# Patient Record
Sex: Female | Born: 1935 | Race: White | Hispanic: No | Smoking: Never smoker
Health system: Southern US, Community
[De-identification: ages and names within clinical notes are randomized; demographics above are authoritative.]

## PROBLEM LIST (undated history)

## (undated) DIAGNOSIS — I10 Essential (primary) hypertension: Secondary | ICD-10-CM

## (undated) DIAGNOSIS — F0390 Unspecified dementia without behavioral disturbance: Secondary | ICD-10-CM

## (undated) DIAGNOSIS — E079 Disorder of thyroid, unspecified: Secondary | ICD-10-CM

## (undated) DIAGNOSIS — R609 Edema, unspecified: Secondary | ICD-10-CM

## (undated) HISTORY — DX: Essential (primary) hypertension: I10

## (undated) HISTORY — DX: Unspecified dementia, unspecified severity, without behavioral disturbance, psychotic disturbance, mood disturbance, and anxiety: F03.90

## (undated) HISTORY — DX: Edema, unspecified: R60.9

## (undated) HISTORY — DX: Disorder of thyroid, unspecified: E07.9

---

## 2004-06-12 ENCOUNTER — Ambulatory Visit (HOSPITAL_COMMUNITY): Admission: RE | Admit: 2004-06-12 | Discharge: 2004-06-12 | Payer: Self-pay | Admitting: Family Medicine

## 2006-02-23 ENCOUNTER — Ambulatory Visit (HOSPITAL_COMMUNITY): Admission: RE | Admit: 2006-02-23 | Discharge: 2006-02-23 | Payer: Self-pay | Admitting: Family Medicine

## 2006-04-29 ENCOUNTER — Ambulatory Visit (HOSPITAL_COMMUNITY): Admission: RE | Admit: 2006-04-29 | Discharge: 2006-04-29 | Payer: Self-pay | Admitting: Family Medicine

## 2006-06-03 ENCOUNTER — Ambulatory Visit (HOSPITAL_COMMUNITY): Admission: RE | Admit: 2006-06-03 | Discharge: 2006-06-03 | Payer: Self-pay | Admitting: Cardiovascular Disease

## 2006-06-08 ENCOUNTER — Ambulatory Visit (HOSPITAL_BASED_OUTPATIENT_CLINIC_OR_DEPARTMENT_OTHER): Admission: RE | Admit: 2006-06-08 | Discharge: 2006-06-08 | Payer: Self-pay | Admitting: Orthopedic Surgery

## 2007-08-03 ENCOUNTER — Ambulatory Visit (HOSPITAL_COMMUNITY): Admission: RE | Admit: 2007-08-03 | Discharge: 2007-08-03 | Payer: Self-pay | Admitting: Family Medicine

## 2008-08-11 ENCOUNTER — Ambulatory Visit (HOSPITAL_COMMUNITY): Admission: RE | Admit: 2008-08-11 | Discharge: 2008-08-11 | Payer: Self-pay | Admitting: Family Medicine

## 2009-01-24 ENCOUNTER — Ambulatory Visit (HOSPITAL_COMMUNITY): Admission: RE | Admit: 2009-01-24 | Discharge: 2009-01-24 | Payer: Self-pay | Admitting: Neurology

## 2011-02-28 NOTE — Op Note (Signed)
[name], Teresa Douglas                ACCOUNT NO.:  [id number]   MEDICAL RECORD NO.:  [id number]          PATIENT TYPE:  AMB   LOCATION:  DSC                          FACILITY:  [facility]   PHYSICIAN:  Robert A. Thurston Hole, M.D. DATE OF BIRTH:  May 04, 1926   DATE OF PROCEDURE:  06/08/2006  DATE OF DISCHARGE:                                 OPERATIVE REPORT   PREOPERATIVE DIAGNOSIS:  Left knee medial and lateral meniscal tears with  chondromalacia, degenerative joint disease and synovitis.   POSTOPERATIVE DIAGNOSIS:  Left knee medial and lateral meniscal tears with  chondromalacia, degenerative joint disease and synovitis.   PROCEDURE:  1. Left knee EUA followed by arthroscopic partial medial lateral      meniscectomies.  2. Left knee chondroplasty with partial synovectomy.   SURGEON:  Elana Alm. Thurston Hole, M.D.   ASSISTANT:  Julien Girt, P.A.   ANESTHESIA:  Local with MAC.   OPERATIVE TIME:  30 minutes.   COMPLICATIONS:  None.   INDICATIONS FOR PROCEDURE:  [name] is a 79-year woman who has had 6 months  of increasing left knee pain with exam and MRI and x-rays documenting  chondromalacia, meniscal tearing and DJD and synovitis.  She has failed  conservative care is now to undergo arthroscopy.   DESCRIPTION:  [name] is brought to operating room on 06/08/2006 after knee  block was placed in holding room by anesthesia.  She was placed on operative  table supine position.  Her left knee was examined under anesthesia.  Range  of motion 0 to 125 degrees, 2 to 3+ crepitation, knee stable to ligamentous  exam with normal patellar tracking.  Left leg was prepped using sterile  DuraPrep and draped using sterile technique.  Originally through an  anterolateral portal the arthroscope with a pump attached was placed and  through an anteromedial portal an arthroscopic probe was placed.  On initial  inspection of the medial compartment.  She was found to have 75% grade 3 and  20% grade 4  DJD and chondromalacia.  This was thoroughly debrided.  A  chondroplasty was performed.  She had complex tearing of the posterior  medial and anterior horn of the medial meniscus of which 75 to 80% was  resected back to stable rim.  Intercondylar notch inspected.  Anterior  posterior cruciate ligaments were normal.  Lateral compartment inspected.  She had 50% grade 3 chondromalacia which was debrided.  Lateral meniscus  tear 25% posterolateral corner was resected back to stable rim.  Patellofemoral joint showed 80% grade 3 and 20% grade 4 DJD and  chondromalacia.  This was thoroughly debrided.  The patella tracked  normally.  Significant synovitis in the medial lateral gutters were  debrided.  Otherwise this was free of pathology.  After this was done, it  was felt that all pathology been satisfactorily addressed.  The instruments  removed.  Portals closed with 3-0 nylon suture and injected with 0.25%  Marcaine with epinephrine and 4 mg morphine.  Sterile dressings applied and  the patient awakened and taken to recovery in stable condition.   FOLLOW-UP  CARE:  [name] will be followed as outpatient on Darvocet for  pain and ibuprofen.  See her back in the office in a week for sutures out  follow-up.      Robert A. Thurston Hole, M.D.  Electronically Signed     RAW/MEDQ  D:  06/08/2006  T:  06/09/2006  Job:  [id number]

## 2011-10-30 DIAGNOSIS — E039 Hypothyroidism, unspecified: Secondary | ICD-10-CM | POA: Diagnosis not present

## 2011-10-30 DIAGNOSIS — I10 Essential (primary) hypertension: Secondary | ICD-10-CM | POA: Diagnosis not present

## 2011-10-30 DIAGNOSIS — R7309 Other abnormal glucose: Secondary | ICD-10-CM | POA: Diagnosis not present

## 2011-10-30 DIAGNOSIS — E559 Vitamin D deficiency, unspecified: Secondary | ICD-10-CM | POA: Diagnosis not present

## 2011-11-06 DIAGNOSIS — I10 Essential (primary) hypertension: Secondary | ICD-10-CM | POA: Diagnosis not present

## 2011-11-06 DIAGNOSIS — R7309 Other abnormal glucose: Secondary | ICD-10-CM | POA: Diagnosis not present

## 2011-11-06 DIAGNOSIS — E039 Hypothyroidism, unspecified: Secondary | ICD-10-CM | POA: Diagnosis not present

## 2011-11-06 DIAGNOSIS — E559 Vitamin D deficiency, unspecified: Secondary | ICD-10-CM | POA: Diagnosis not present

## 2011-11-06 DIAGNOSIS — R634 Abnormal weight loss: Secondary | ICD-10-CM | POA: Diagnosis not present

## 2011-11-18 DIAGNOSIS — R0989 Other specified symptoms and signs involving the circulatory and respiratory systems: Secondary | ICD-10-CM | POA: Diagnosis not present

## 2011-11-18 DIAGNOSIS — I10 Essential (primary) hypertension: Secondary | ICD-10-CM | POA: Diagnosis not present

## 2011-12-25 DIAGNOSIS — R634 Abnormal weight loss: Secondary | ICD-10-CM | POA: Diagnosis not present

## 2011-12-25 DIAGNOSIS — I10 Essential (primary) hypertension: Secondary | ICD-10-CM | POA: Diagnosis not present

## 2012-01-15 DIAGNOSIS — R634 Abnormal weight loss: Secondary | ICD-10-CM | POA: Diagnosis not present

## 2012-01-15 DIAGNOSIS — R32 Unspecified urinary incontinence: Secondary | ICD-10-CM | POA: Diagnosis not present

## 2012-01-15 DIAGNOSIS — I10 Essential (primary) hypertension: Secondary | ICD-10-CM | POA: Diagnosis not present

## 2012-01-20 DIAGNOSIS — E559 Vitamin D deficiency, unspecified: Secondary | ICD-10-CM | POA: Diagnosis not present

## 2012-01-20 DIAGNOSIS — R634 Abnormal weight loss: Secondary | ICD-10-CM | POA: Diagnosis not present

## 2012-01-27 DIAGNOSIS — N390 Urinary tract infection, site not specified: Secondary | ICD-10-CM | POA: Diagnosis not present

## 2012-02-03 DIAGNOSIS — R05 Cough: Secondary | ICD-10-CM | POA: Diagnosis not present

## 2012-02-04 DIAGNOSIS — R918 Other nonspecific abnormal finding of lung field: Secondary | ICD-10-CM | POA: Diagnosis not present

## 2012-02-04 DIAGNOSIS — R509 Fever, unspecified: Secondary | ICD-10-CM | POA: Diagnosis not present

## 2012-02-04 DIAGNOSIS — J189 Pneumonia, unspecified organism: Secondary | ICD-10-CM | POA: Diagnosis not present

## 2012-02-04 DIAGNOSIS — J8410 Pulmonary fibrosis, unspecified: Secondary | ICD-10-CM | POA: Diagnosis not present

## 2012-02-04 DIAGNOSIS — J989 Respiratory disorder, unspecified: Secondary | ICD-10-CM | POA: Diagnosis not present

## 2012-02-05 DIAGNOSIS — R509 Fever, unspecified: Secondary | ICD-10-CM | POA: Diagnosis not present

## 2012-02-05 DIAGNOSIS — R0902 Hypoxemia: Secondary | ICD-10-CM | POA: Diagnosis not present

## 2012-02-05 DIAGNOSIS — R918 Other nonspecific abnormal finding of lung field: Secondary | ICD-10-CM | POA: Diagnosis not present

## 2012-02-05 DIAGNOSIS — Z79899 Other long term (current) drug therapy: Secondary | ICD-10-CM | POA: Diagnosis not present

## 2012-02-05 DIAGNOSIS — J449 Chronic obstructive pulmonary disease, unspecified: Secondary | ICD-10-CM | POA: Diagnosis not present

## 2012-02-05 DIAGNOSIS — E876 Hypokalemia: Secondary | ICD-10-CM | POA: Diagnosis not present

## 2012-02-05 DIAGNOSIS — Z882 Allergy status to sulfonamides status: Secondary | ICD-10-CM | POA: Diagnosis not present

## 2012-02-05 DIAGNOSIS — E039 Hypothyroidism, unspecified: Secondary | ICD-10-CM | POA: Diagnosis present

## 2012-02-05 DIAGNOSIS — J159 Unspecified bacterial pneumonia: Secondary | ICD-10-CM | POA: Diagnosis present

## 2012-02-05 DIAGNOSIS — J189 Pneumonia, unspecified organism: Secondary | ICD-10-CM | POA: Diagnosis not present

## 2012-02-05 DIAGNOSIS — Z880 Allergy status to penicillin: Secondary | ICD-10-CM | POA: Diagnosis not present

## 2012-02-05 DIAGNOSIS — Z886 Allergy status to analgesic agent status: Secondary | ICD-10-CM | POA: Diagnosis not present

## 2012-02-05 DIAGNOSIS — I10 Essential (primary) hypertension: Secondary | ICD-10-CM | POA: Diagnosis not present

## 2012-02-05 DIAGNOSIS — J8410 Pulmonary fibrosis, unspecified: Secondary | ICD-10-CM | POA: Diagnosis not present

## 2012-02-05 DIAGNOSIS — F0280 Dementia in other diseases classified elsewhere without behavioral disturbance: Secondary | ICD-10-CM | POA: Diagnosis present

## 2012-02-05 DIAGNOSIS — R3989 Other symptoms and signs involving the genitourinary system: Secondary | ICD-10-CM | POA: Diagnosis not present

## 2012-02-05 DIAGNOSIS — F411 Generalized anxiety disorder: Secondary | ICD-10-CM | POA: Diagnosis present

## 2012-02-12 DIAGNOSIS — J449 Chronic obstructive pulmonary disease, unspecified: Secondary | ICD-10-CM | POA: Diagnosis not present

## 2012-02-12 DIAGNOSIS — E876 Hypokalemia: Secondary | ICD-10-CM | POA: Diagnosis not present

## 2012-02-12 DIAGNOSIS — J189 Pneumonia, unspecified organism: Secondary | ICD-10-CM | POA: Diagnosis not present

## 2012-02-12 DIAGNOSIS — I10 Essential (primary) hypertension: Secondary | ICD-10-CM | POA: Diagnosis not present

## 2012-06-17 DIAGNOSIS — F0390 Unspecified dementia without behavioral disturbance: Secondary | ICD-10-CM | POA: Diagnosis not present

## 2012-06-17 DIAGNOSIS — I10 Essential (primary) hypertension: Secondary | ICD-10-CM | POA: Diagnosis not present

## 2012-06-17 DIAGNOSIS — J449 Chronic obstructive pulmonary disease, unspecified: Secondary | ICD-10-CM | POA: Diagnosis not present

## 2012-06-17 DIAGNOSIS — R634 Abnormal weight loss: Secondary | ICD-10-CM | POA: Diagnosis not present

## 2012-09-03 DIAGNOSIS — F0390 Unspecified dementia without behavioral disturbance: Secondary | ICD-10-CM | POA: Diagnosis not present

## 2012-09-03 DIAGNOSIS — I10 Essential (primary) hypertension: Secondary | ICD-10-CM | POA: Diagnosis not present

## 2012-09-03 DIAGNOSIS — E039 Hypothyroidism, unspecified: Secondary | ICD-10-CM | POA: Diagnosis not present

## 2012-10-28 DIAGNOSIS — E039 Hypothyroidism, unspecified: Secondary | ICD-10-CM | POA: Diagnosis not present

## 2012-10-28 DIAGNOSIS — R634 Abnormal weight loss: Secondary | ICD-10-CM | POA: Diagnosis not present

## 2012-10-28 DIAGNOSIS — I10 Essential (primary) hypertension: Secondary | ICD-10-CM | POA: Diagnosis not present

## 2012-10-28 DIAGNOSIS — J209 Acute bronchitis, unspecified: Secondary | ICD-10-CM | POA: Diagnosis not present

## 2012-11-05 DIAGNOSIS — J209 Acute bronchitis, unspecified: Secondary | ICD-10-CM | POA: Diagnosis not present

## 2012-11-05 DIAGNOSIS — E039 Hypothyroidism, unspecified: Secondary | ICD-10-CM | POA: Diagnosis not present

## 2012-11-05 DIAGNOSIS — I10 Essential (primary) hypertension: Secondary | ICD-10-CM | POA: Diagnosis not present

## 2012-11-05 DIAGNOSIS — R05 Cough: Secondary | ICD-10-CM | POA: Diagnosis not present

## 2013-01-20 DIAGNOSIS — F0390 Unspecified dementia without behavioral disturbance: Secondary | ICD-10-CM | POA: Diagnosis not present

## 2013-01-20 DIAGNOSIS — I10 Essential (primary) hypertension: Secondary | ICD-10-CM | POA: Diagnosis not present

## 2013-01-20 DIAGNOSIS — J309 Allergic rhinitis, unspecified: Secondary | ICD-10-CM | POA: Diagnosis not present

## 2013-01-20 DIAGNOSIS — J069 Acute upper respiratory infection, unspecified: Secondary | ICD-10-CM | POA: Diagnosis not present

## 2013-01-27 DIAGNOSIS — R262 Difficulty in walking, not elsewhere classified: Secondary | ICD-10-CM | POA: Diagnosis not present

## 2013-01-27 DIAGNOSIS — B351 Tinea unguium: Secondary | ICD-10-CM | POA: Diagnosis not present

## 2013-01-27 DIAGNOSIS — M79609 Pain in unspecified limb: Secondary | ICD-10-CM | POA: Diagnosis not present

## 2013-03-15 DIAGNOSIS — J811 Chronic pulmonary edema: Secondary | ICD-10-CM | POA: Diagnosis not present

## 2013-03-17 DIAGNOSIS — I10 Essential (primary) hypertension: Secondary | ICD-10-CM | POA: Diagnosis not present

## 2013-03-17 DIAGNOSIS — F0390 Unspecified dementia without behavioral disturbance: Secondary | ICD-10-CM | POA: Diagnosis not present

## 2013-03-17 DIAGNOSIS — J209 Acute bronchitis, unspecified: Secondary | ICD-10-CM | POA: Diagnosis not present

## 2013-03-17 DIAGNOSIS — J309 Allergic rhinitis, unspecified: Secondary | ICD-10-CM | POA: Diagnosis not present

## 2013-04-19 DIAGNOSIS — B351 Tinea unguium: Secondary | ICD-10-CM | POA: Diagnosis not present

## 2013-05-11 DIAGNOSIS — M25539 Pain in unspecified wrist: Secondary | ICD-10-CM | POA: Diagnosis not present

## 2013-06-16 DIAGNOSIS — I10 Essential (primary) hypertension: Secondary | ICD-10-CM | POA: Diagnosis not present

## 2013-06-16 DIAGNOSIS — J309 Allergic rhinitis, unspecified: Secondary | ICD-10-CM | POA: Diagnosis not present

## 2013-06-16 DIAGNOSIS — F0390 Unspecified dementia without behavioral disturbance: Secondary | ICD-10-CM | POA: Diagnosis not present

## 2013-06-28 DIAGNOSIS — I10 Essential (primary) hypertension: Secondary | ICD-10-CM | POA: Diagnosis not present

## 2013-06-28 DIAGNOSIS — I739 Peripheral vascular disease, unspecified: Secondary | ICD-10-CM | POA: Diagnosis not present

## 2013-06-28 DIAGNOSIS — R220 Localized swelling, mass and lump, head: Secondary | ICD-10-CM | POA: Diagnosis not present

## 2013-06-28 DIAGNOSIS — R0989 Other specified symptoms and signs involving the circulatory and respiratory systems: Secondary | ICD-10-CM | POA: Diagnosis not present

## 2013-10-03 DIAGNOSIS — I70209 Unspecified atherosclerosis of native arteries of extremities, unspecified extremity: Secondary | ICD-10-CM | POA: Diagnosis not present

## 2013-10-03 DIAGNOSIS — B351 Tinea unguium: Secondary | ICD-10-CM | POA: Diagnosis not present

## 2013-10-13 DIAGNOSIS — Z23 Encounter for immunization: Secondary | ICD-10-CM | POA: Diagnosis not present

## 2013-11-03 DIAGNOSIS — F0390 Unspecified dementia without behavioral disturbance: Secondary | ICD-10-CM | POA: Diagnosis not present

## 2013-11-03 DIAGNOSIS — J309 Allergic rhinitis, unspecified: Secondary | ICD-10-CM | POA: Diagnosis not present

## 2013-11-03 DIAGNOSIS — I10 Essential (primary) hypertension: Secondary | ICD-10-CM | POA: Diagnosis not present

## 2013-11-03 DIAGNOSIS — E039 Hypothyroidism, unspecified: Secondary | ICD-10-CM | POA: Diagnosis not present

## 2013-11-15 DIAGNOSIS — E039 Hypothyroidism, unspecified: Secondary | ICD-10-CM | POA: Diagnosis not present

## 2013-11-15 DIAGNOSIS — E559 Vitamin D deficiency, unspecified: Secondary | ICD-10-CM | POA: Diagnosis not present

## 2013-11-15 DIAGNOSIS — I10 Essential (primary) hypertension: Secondary | ICD-10-CM | POA: Diagnosis not present

## 2013-12-02 DIAGNOSIS — F0390 Unspecified dementia without behavioral disturbance: Secondary | ICD-10-CM | POA: Diagnosis not present

## 2013-12-02 DIAGNOSIS — I10 Essential (primary) hypertension: Secondary | ICD-10-CM | POA: Diagnosis not present

## 2013-12-02 DIAGNOSIS — R059 Cough, unspecified: Secondary | ICD-10-CM | POA: Diagnosis not present

## 2013-12-02 DIAGNOSIS — J209 Acute bronchitis, unspecified: Secondary | ICD-10-CM | POA: Diagnosis not present

## 2013-12-03 DIAGNOSIS — R05 Cough: Secondary | ICD-10-CM | POA: Diagnosis not present

## 2013-12-03 DIAGNOSIS — R059 Cough, unspecified: Secondary | ICD-10-CM | POA: Diagnosis not present

## 2013-12-26 DIAGNOSIS — M7989 Other specified soft tissue disorders: Secondary | ICD-10-CM | POA: Diagnosis not present

## 2013-12-26 DIAGNOSIS — M79609 Pain in unspecified limb: Secondary | ICD-10-CM | POA: Diagnosis not present

## 2013-12-26 DIAGNOSIS — L539 Erythematous condition, unspecified: Secondary | ICD-10-CM | POA: Diagnosis not present

## 2013-12-30 ENCOUNTER — Telehealth: Payer: Self-pay | Admitting: General Practice

## 2013-12-30 DIAGNOSIS — L0390 Cellulitis, unspecified: Secondary | ICD-10-CM | POA: Diagnosis not present

## 2013-12-30 DIAGNOSIS — R269 Unspecified abnormalities of gait and mobility: Secondary | ICD-10-CM | POA: Diagnosis not present

## 2013-12-30 DIAGNOSIS — J209 Acute bronchitis, unspecified: Secondary | ICD-10-CM | POA: Diagnosis not present

## 2013-12-30 DIAGNOSIS — R609 Edema, unspecified: Secondary | ICD-10-CM | POA: Diagnosis not present

## 2013-12-30 NOTE — Telephone Encounter (Signed)
PT AWARE AND APPT MADE

## 2013-12-31 DIAGNOSIS — M25559 Pain in unspecified hip: Secondary | ICD-10-CM | POA: Diagnosis not present

## 2014-01-02 ENCOUNTER — Ambulatory Visit: Payer: Self-pay | Admitting: Family Medicine

## 2014-01-03 DIAGNOSIS — B351 Tinea unguium: Secondary | ICD-10-CM | POA: Diagnosis not present

## 2014-01-03 DIAGNOSIS — I70209 Unspecified atherosclerosis of native arteries of extremities, unspecified extremity: Secondary | ICD-10-CM | POA: Diagnosis not present

## 2014-01-03 DIAGNOSIS — M79609 Pain in unspecified limb: Secondary | ICD-10-CM | POA: Diagnosis not present

## 2014-01-09 ENCOUNTER — Encounter (INDEPENDENT_AMBULATORY_CARE_PROVIDER_SITE_OTHER): Payer: Self-pay

## 2014-01-09 ENCOUNTER — Ambulatory Visit (INDEPENDENT_AMBULATORY_CARE_PROVIDER_SITE_OTHER): Payer: Medicare Other | Admitting: Family Medicine

## 2014-01-09 VITALS — BP 139/73 | HR 82 | Temp 98.4°F | Wt 187.0 lb

## 2014-01-09 DIAGNOSIS — E559 Vitamin D deficiency, unspecified: Secondary | ICD-10-CM | POA: Diagnosis not present

## 2014-01-09 DIAGNOSIS — R269 Unspecified abnormalities of gait and mobility: Secondary | ICD-10-CM | POA: Diagnosis not present

## 2014-01-09 DIAGNOSIS — R609 Edema, unspecified: Secondary | ICD-10-CM

## 2014-01-09 DIAGNOSIS — F0390 Unspecified dementia without behavioral disturbance: Secondary | ICD-10-CM | POA: Diagnosis not present

## 2014-01-09 NOTE — Addendum Note (Signed)
Addended by: Lysbeth Penner on: 01/09/2014 06:26 PM   Modules accepted: Orders

## 2014-01-09 NOTE — Addendum Note (Signed)
Addended by: Wyline Mood on: 01/09/2014 06:34 PM   Modules accepted: Orders

## 2014-01-09 NOTE — Progress Notes (Signed)
   Subjective:    Patient ID: Teresa Douglas, female    DOB: Mar 08, 1926, 80 y.o.   MRN: [id number]  HPI This 80 y.o. female presents for evaluation of edema.  She has hx of dementia. She is confused and combative.  She has been residing at [place] unit And she is brought here today by the staff for evaluation.  Her niece Teresa Douglas Is called and she is concerned about her aunt (patient) edema and her recent decreased Ability to ambulate.  She states the patient has not been getting around very well with her walker. She is concerned she may have right heart failure and wants this checked.   Review of Systems C/o edema and decreased mobility No chest pain, SOB, HA, dizziness, vision change, N/V, diarrhea, constipation, dysuria, urinary urgency or frequency, myalgias, arthralgias or rash.     Objective:   Physical Exam Vital signs noted  Elderly confused female in NAD.  HEENT - Head atraumatic Normocephalic                Eyes - PERRLA, Conjuctiva - clear Sclera- Clear EOMI                Ears - EAC's Wnl TM's Wnl Gross Hearing WNL                Throat - oropharanx wnl Respiratory - Lungs CTA bilateral Cardiac - RRR S1 and S2 without murmur GI - Abdomen soft Nontender and bowel sounds active x 4 Extremities - mild 1 plus pedal and pre-tibial edema. Neuro - Confused        Assessment & Plan:  Dementia - Plan: Ambulatory referral to Neurology  Edema - Plan: Echocardiogram Dobutamine stress test, POCT CBC, CMP14+EGFR, TSH, Vit D  25 hydroxy (rtn osteoporosis monitoring)  Abnormality of gait - Plan: Ambulatory referral to Neurology  Vitamin D deficiency - Check vitamin D level.  Lysbeth Penner FNP

## 2014-01-10 LAB — CBC WITH DIFFERENTIAL
Basophils Absolute: 0.1 10*3/uL (ref 0.0–0.2)
Basos: 1 %
Eos: 2 %
Eosinophils Absolute: 0.2 10*3/uL (ref 0.0–0.4)
HCT: 41.7 % (ref 34.0–46.6)
Hemoglobin: 13.4 g/dL (ref 11.1–15.9)
Immature Grans (Abs): 0 10*3/uL (ref 0.0–0.1)
Immature Granulocytes: 0 %
Lymphocytes Absolute: 2.3 10*3/uL (ref 0.7–3.1)
Lymphs: 28 %
MCH: 31.9 pg (ref 26.6–33.0)
MCHC: 32.1 g/dL (ref 31.5–35.7)
MCV: 99 fL — ABNORMAL HIGH (ref 79–97)
Monocytes Absolute: 0.7 10*3/uL (ref 0.1–0.9)
Monocytes: 8 %
Neutrophils Absolute: 5.2 10*3/uL (ref 1.4–7.0)
Neutrophils Relative %: 61 %
Platelets: 169 10*3/uL (ref 150–379)
RBC: 4.2 x10E6/uL (ref 3.77–5.28)
RDW: 13.9 % (ref 12.3–15.4)
WBC: 8.4 10*3/uL (ref 3.4–10.8)

## 2014-01-10 LAB — CMP14+EGFR
ALT: 12 IU/L (ref 0–32)
AST: 14 IU/L (ref 0–40)
Albumin/Globulin Ratio: 1.6 (ref 1.1–2.5)
Albumin: 4.4 g/dL (ref 3.5–4.7)
Alkaline Phosphatase: 72 IU/L (ref 39–117)
BUN/Creatinine Ratio: 16 (ref 11–26)
BUN: 14 mg/dL (ref 8–27)
CO2: 26 mmol/L (ref 18–29)
Calcium: 9.3 mg/dL (ref 8.7–10.3)
Chloride: 100 mmol/L (ref 97–108)
Creatinine, Ser: 0.85 mg/dL (ref 0.57–1.00)
GFR calc Af Amer: 72 mL/min/{1.73_m2} (ref >59)
GFR calc non Af Amer: 63 mL/min/{1.73_m2} (ref >59)
Globulin, Total: 2.8 g/dL (ref 1.5–4.5)
Glucose: 109 mg/dL — ABNORMAL HIGH (ref 65–99)
Potassium: 4.3 mmol/L (ref 3.5–5.2)
Sodium: 144 mmol/L (ref 134–144)
Total Bilirubin: 0.3 mg/dL (ref 0.0–1.2)
Total Protein: 7.2 g/dL (ref 6.0–8.5)

## 2014-01-10 LAB — TSH: TSH: 2.08 u[IU]/mL (ref 0.450–4.500)

## 2014-01-10 LAB — VITAMIN D 25 HYDROXY (VIT D DEFICIENCY, FRACTURES): Vit D, 25-Hydroxy: 16.1 ng/mL — ABNORMAL LOW (ref 30.0–100.0)

## 2014-01-11 ENCOUNTER — Other Ambulatory Visit: Payer: Self-pay | Admitting: Family Medicine

## 2014-01-19 ENCOUNTER — Ambulatory Visit (INDEPENDENT_AMBULATORY_CARE_PROVIDER_SITE_OTHER): Payer: Medicare Other | Admitting: Neurology

## 2014-01-19 ENCOUNTER — Encounter: Payer: Self-pay | Admitting: Neurology

## 2014-01-19 VITALS — BP 104/67 | HR 66 | Ht 66.0 in | Wt 182.0 lb

## 2014-01-19 DIAGNOSIS — R451 Restlessness and agitation: Secondary | ICD-10-CM

## 2014-01-19 DIAGNOSIS — IMO0002 Reserved for concepts with insufficient information to code with codable children: Secondary | ICD-10-CM

## 2014-01-19 DIAGNOSIS — G309 Alzheimer's disease, unspecified: Principal | ICD-10-CM

## 2014-01-19 DIAGNOSIS — R269 Unspecified abnormalities of gait and mobility: Secondary | ICD-10-CM | POA: Diagnosis not present

## 2014-01-19 DIAGNOSIS — F0280 Dementia in other diseases classified elsewhere without behavioral disturbance: Secondary | ICD-10-CM | POA: Diagnosis not present

## 2014-01-19 MED ORDER — DONEPEZIL HCL 23 MG PO TABS: 23.0000 mg | ORAL_TABLET | Freq: Every day | ORAL | Status: AC

## 2014-01-19 NOTE — Progress Notes (Signed)
[name] [address] [place]. [place] [address] [phone]       OFFICE CONSULT NOTE  [name] Date of Birth:  1925-12-20 Medical Record Number:  [id number]   Referring MD:  Lysbeth Penner, FNP  Reason for Referral:  Gait difficulty and dementia  HPI: Teresa Douglas is a Caucasian elderly lady with advanced Alzheimer's dementia. She is unable to provide any history and I called the patient's health power of attorney Teresa Douglas but was unable to reach her on her cell phone or on her home phone number. The history has surmised by the referral notes that the patient has had recent decline in her gait and balance as well as some peripheral edema on her feet. The patient is accompanied by a caregiver who does not know the patient well enough to provide meaningful history. The patient is clearly quite advanced in a dementia and has issues with agitation  , confusion and combativeness. It takes a constant effort  even to keep the patient in the room as she tends to wander off and she clearly has difficulty with walking and balance with significant gait initiation apraxia issues. The patient apparently had been walking quite well with a walker but often late has had trouble walking with poor balance. She is currently on Aricept 10 mg daily and I have no knowledge whether she's tried a higher dose in the past or Namenda.  ROS:   14 system review of systems is positive for memory loss, confusion, incontinence,Gait difficulty, agitation PMH:  Past Medical History  Diagnosis Date  . Dementia   . Edema     Social History:  History   Social History  . Marital Status: Widowed    Spouse Name: Teresa Douglas    Number of Children: 4  . Years of Education: Teresa Douglas   Occupational History  . retired    Social History Main Topics  . Smoking status: Never Smoker   . Smokeless tobacco: Not on file  . Alcohol Use: No  . Drug Use: No  . Sexual Activity: No   Other Topics  Concern  . Not on file   Social History Narrative   Patient lives at [facility] community.    Medications:   Current Outpatient Prescriptions on File Prior to Visit  Medication Sig Dispense Refill  . acetaminophen (TYLENOL) 500 MG tablet Take 1,000 mg by mouth every 6 (six) hours as needed.      Marland Kitchen atenolol (TENORMIN) 25 MG tablet Take 25 mg by mouth daily.      Marland Kitchen levothyroxine (SYNTHROID, LEVOTHROID) 50 MCG tablet Take 50 mcg by mouth daily before breakfast.      . Vitamin D, Ergocalciferol, (DRISDOL) 50000 UNITS CAPS capsule Take 50,000 Units by mouth every 7 (seven) days.       No current facility-administered medications on file prior to visit.    Allergies:   Allergies  Allergen Reactions  . Codeine   . Morphine And Related   . Penicillins   . Sulfa Antibiotics     Physical Exam General: Frail elderly Caucasian lady, seated, in no evident distress Head: head normocephalic and atraumatic. Orohparynx benign Neck: supple with no carotid or supraclavicular bruits Cardiovascular: regular rate and rhythm, no murmurs Musculoskeletal: Mild kyphoscoliosis Skin:  no rash/petichiae Vascular:  Normal pulses all extremities Filed Vitals:   01/19/14 1326  BP: 104/67  Pulse: 66    Neurologic Exam Mental Status: Awake and fully alert.  Disoriented to place and time. Recent and remote memory poor Attention span, concentration and fund of knowledge very poor .not cooperative for a Mini-Mental status exam. Gets restless and combative easily but can be redirected by her caregiver. At time tries to hit her caregiver when she is upset   Cranial Nerves: Fundoscopic exam not cooperative   Pupils equal, briskly reactive to light. Extraocular movements full without nystagmus. Visual fields blinks bilaterally to threat. Hearing poor. Facial sensation intact. Face, tongue, palate moves normally and symmetrically.  Motor: Normal bulk and tone. Normal strength in all tested extremity  muscles. Sensory.: intact to touch and  vibratory sensation.  Coordination: not cooperative Gait and Station: Arises from chair with  Difficulty and 2 person assist. With severe gait initiation apraxia. Walks with a broad-based slightly ataxic gait and needs to hold on 2 objects. Slightly unsteady on  a narrow base Reflexes: 1+ and symmetric. Toes downgoing.       ASSESSMENT: 58 year lady with advanced R. 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 History and exam is quite limited due to her condition and absence of family    PLAN: I had a long discussion the patient's caregiver and explained that evaluation and examination is limited given patient's agitation and tendency to wander off. She clearly has no significant motor limitations for her gait and she does have significant apraxia which will be related to her advanced Alzheimer's dementia. Check labwork for treatable causes of dementia and CT scan of the head and increase Aricept 23 mg daily if tolerated. I will speak to the patient's health power of attorney her niece and make future plans later. Follow up in the future only if necessary.    Note: This document was prepared with digital dictation and possible smart phrase technology. Any transcriptional errors that result from this process are unintentional.

## 2014-01-19 NOTE — Patient Instructions (Addendum)
I had a long discussion the patient's caregiver and explained that evaluation and examination is limited given patient's agitation and tendency to wander off. She clearly has no significant motor limitations for her gait and she does have significant apraxia which will be related to her advanced Alzheimer's dementia. Check labwork for treatable causes of dementia and CT scan of the head and increase Aricept 23 mg daily if tolerated. I will speak to the patient's health power of attorney her niece and make future plans later. Follow up in the future only if necessary.  Alzheimer's Disease Caregiver Guide A person who has Alzheimer's disease may not be able to take care of himself or herself. He or she may need help with simple tasks. The tips below can help you care for the person. MEMORY LOSS AND CONFUSION If the person is confused or cannot remember things:  Stay calm.  Respond with a short answer.  Avoid correcting him or her in a way that sounds like scolding.  Try not to take it personally, even if he or she forgets your name. BEHAVIOR CHANGES The person may go through behavior changes. This can include depression, anxiety, anger, or seeing things that are not there. When behavior changes:  Try not to take behavior changes personally.  Stay calm and patient.  Do not argue or try to convince the person about a specific point.  Know that these changes are part of the disease process. Try to work through it. TIPS TO LESSEN FRUSTRATION  Make appointments and do daily tasks when the person is at his or her best.  Take your time. Simple tasks may take longer. Allow plenty of time to complete tasks.  Limit choices for the person.  Involve the person in what you are doing.  Stick to a routine.  Avoid new or crowded places, if possible.  Use simple words, short sentences, and a calm voice. Only give 1 direction at a time.  Buy clothes and shoes that are easy to put on and take  off.  Let people help if they offer. HOME SAFETY  Keep floors clear. Remove rugs, magazine racks, and floor lamps.  Keep hallways well lit.  Put a handrail and nonslip mat in the bathtub or shower.  Put childproof locks on cabinets that have dangerous items in them. These items include medicine, alcohol, guns, toxic cleaning items, sharp tools, matches, or lighters.  Place locks on doors where the person cannot see or reach them. This helps the person to not wander out of the house and get lost.  Be prepared for emergencies. Keep a list of emergency phone numbers and addresses in a handy area. PLANS FOR THE FUTURE   Talk about finances.  Talk about money management. People with Alzheimer's disease have trouble managing their money as the disease gets worse.  Get help from professional advisors about financial and legal matters.  Talk about future care.  Choose a power of attorney. This is someone who can make decisions for the person with Alzheimer's disease when he or she can no longer do so.  Talk about driving and when it is the right time to stop. The person's doctor can help with this.  If the person lives alone, make sure he or she is safe. Some people need extra help at home. Other people need more care at a nursing home or care center. SUPPORT GROUPS  Some benefits of joining a support group include:   Learning ways to manage stress.  Sharing experiences with others.  Getting emotional comfort and support.  Learning new caregiving skills as the disease progresses.  Knowing what community resources are available and taking advantage of them. GET HELP IF:  The person has a fever.  The person has a sudden behavior change that does not get better with calming strategies.  The person is unable to manage his or her living situation.  The person threatens you or anyone else, including himself or herself.  You are no longer able to care for the person. Document  Released: 12/22/2011 Document Reviewed: 12/22/2011 [facility] Patient Information 2014 [place].

## 2014-01-20 LAB — COMPREHENSIVE METABOLIC PANEL
ALT: 9 IU/L (ref 0–32)
AST: 16 IU/L (ref 0–40)
Albumin/Globulin Ratio: 1.3 (ref 1.1–2.5)
Albumin: 4.3 g/dL (ref 3.5–4.7)
Alkaline Phosphatase: 69 IU/L (ref 39–117)
BUN / CREAT RATIO: 15 (ref 11–26)
BUN: 13 mg/dL (ref 8–27)
CHLORIDE: 97 mmol/L (ref 97–108)
CO2: 29 mmol/L (ref 18–29)
Calcium: 9.7 mg/dL (ref 8.7–10.3)
Creatinine, Ser: 0.88 mg/dL (ref 0.57–1.00)
GFR calc non Af Amer: 60 mL/min/{1.73_m2} (ref >59)
GFR, EST AFRICAN AMERICAN: 69 mL/min/{1.73_m2} (ref >59)
Globulin, Total: 3.2 g/dL (ref 1.5–4.5)
Glucose: 111 mg/dL — ABNORMAL HIGH (ref 65–99)
POTASSIUM: 4.2 mmol/L (ref 3.5–5.2)
SODIUM: 141 mmol/L (ref 134–144)
TOTAL PROTEIN: 7.5 g/dL (ref 6.0–8.5)
Total Bilirubin: 0.4 mg/dL (ref 0.0–1.2)

## 2014-01-20 LAB — CBC WITH DIFFERENTIAL/PLATELET
BASOS: 1 %
Basophils Absolute: 0.1 10*3/uL (ref 0.0–0.2)
Eos: 2 %
Eosinophils Absolute: 0.1 10*3/uL (ref 0.0–0.4)
HEMATOCRIT: 40.5 % (ref 34.0–46.6)
HEMOGLOBIN: 13.6 g/dL (ref 11.1–15.9)
IMMATURE GRANULOCYTES: 0 %
Immature Grans (Abs): 0 10*3/uL (ref 0.0–0.1)
Lymphocytes Absolute: 1.8 10*3/uL (ref 0.7–3.1)
Lymphs: 29 %
MCH: 32.5 pg (ref 26.6–33.0)
MCHC: 33.6 g/dL (ref 31.5–35.7)
MCV: 97 fL (ref 79–97)
MONOS ABS: 0.5 10*3/uL (ref 0.1–0.9)
Monocytes: 8 %
NEUTROS ABS: 3.8 10*3/uL (ref 1.4–7.0)
Neutrophils Relative %: 60 %
RBC: 4.19 x10E6/uL (ref 3.77–5.28)
RDW: 13.9 % (ref 12.3–15.4)
WBC: 6.2 10*3/uL (ref 3.4–10.8)

## 2014-01-20 LAB — RPR: SYPHILIS RPR SCR: NONREACTIVE

## 2014-01-20 LAB — VITAMIN B12: Vitamin B-12: 255 pg/mL (ref 211–946)

## 2014-01-20 LAB — TSH: TSH: 3.83 u[IU]/mL (ref 0.450–4.500)

## 2014-01-21 DIAGNOSIS — I509 Heart failure, unspecified: Secondary | ICD-10-CM | POA: Diagnosis not present

## 2014-01-24 ENCOUNTER — Telehealth: Payer: Self-pay | Admitting: *Deleted

## 2014-01-24 NOTE — Telephone Encounter (Signed)
Debbie returning call to Aimee in regards to patient, please call back and advise.

## 2014-01-26 ENCOUNTER — Ambulatory Visit
Admission: RE | Admit: 2014-01-26 | Discharge: 2014-01-26 | Disposition: A | Discharge status: Home / Self Care | Payer: Medicare Other | Source: Ambulatory Visit | Attending: Neurology | Admitting: Neurology

## 2014-01-26 DIAGNOSIS — G309 Alzheimer's disease, unspecified: Principal | ICD-10-CM

## 2014-01-26 DIAGNOSIS — F0280 Dementia in other diseases classified elsewhere without behavioral disturbance: Secondary | ICD-10-CM

## 2014-01-26 DIAGNOSIS — F0390 Unspecified dementia without behavioral disturbance: Secondary | ICD-10-CM | POA: Diagnosis not present

## 2014-01-26 DIAGNOSIS — R451 Restlessness and agitation: Secondary | ICD-10-CM

## 2014-01-26 DIAGNOSIS — IMO0002 Reserved for concepts with insufficient information to code with codable children: Secondary | ICD-10-CM | POA: Diagnosis not present

## 2014-02-23 ENCOUNTER — Ambulatory Visit (INDEPENDENT_AMBULATORY_CARE_PROVIDER_SITE_OTHER): Payer: Medicare Other | Admitting: Nurse Practitioner

## 2014-02-23 ENCOUNTER — Ambulatory Visit (INDEPENDENT_AMBULATORY_CARE_PROVIDER_SITE_OTHER): Payer: Medicare Other

## 2014-02-23 ENCOUNTER — Other Ambulatory Visit: Payer: Self-pay | Admitting: Nurse Practitioner

## 2014-02-23 ENCOUNTER — Encounter: Payer: Self-pay | Admitting: Nurse Practitioner

## 2014-02-23 VITALS — BP 119/72 | HR 76 | Temp 98.5°F

## 2014-02-23 DIAGNOSIS — R059 Cough, unspecified: Secondary | ICD-10-CM | POA: Diagnosis not present

## 2014-02-23 DIAGNOSIS — J189 Pneumonia, unspecified organism: Secondary | ICD-10-CM | POA: Diagnosis not present

## 2014-02-23 DIAGNOSIS — R05 Cough: Secondary | ICD-10-CM

## 2014-02-23 DIAGNOSIS — J181 Lobar pneumonia, unspecified organism: Secondary | ICD-10-CM

## 2014-02-23 MED ORDER — CEFTRIAXONE SODIUM 1 G IJ SOLR
1.0000 g | INTRAMUSCULAR | Status: AC
  Administered 2014-02-23: 1 g via INTRAMUSCULAR

## 2014-02-23 MED ORDER — CIPROFLOXACIN HCL 500 MG PO TABS: 500.0000 mg | ORAL_TABLET | Freq: Two times a day (BID) | ORAL | Status: DC

## 2014-02-23 NOTE — Progress Notes (Signed)
   Subjective:    Patient ID: Teresa Douglas, female    DOB: 04/04/1926, 80 y.o.   MRN: [id number]  HPI Patient lives at [facility]- they say that she has been running a fever and lots of congestion with cough for 2-3 days.    Review of Systems  Constitutional: Positive for fever and appetite change. Negative for chills.  HENT: Positive for congestion, postnasal drip and rhinorrhea.   Respiratory: Positive for cough.   Cardiovascular: Negative.   Gastrointestinal: Negative.   Genitourinary: Negative.   Neurological: Negative.   Psychiatric/Behavioral: Negative.   All other systems reviewed and are negative.      Objective:   Physical Exam  Constitutional: She is oriented to person, place, and time. She appears well-developed and well-nourished.  HENT:  Right Ear: External ear normal.  Left Ear: External ear normal.  Mouth/Throat: Oropharyngeal exudate present.  Eyes: Pupils are equal, round, and reactive to light.  Neck: Normal range of motion. Neck supple.  Cardiovascular: Normal rate and normal heart sounds.   Pulmonary/Chest: Effort normal. She has wheezes (ins and exp wheezes throughout).  Abdominal: Soft. Bowel sounds are normal.  Lymphadenopathy:    She has no cervical adenopathy.  Neurological: She is alert and oriented to person, place, and time.  Skin: Skin is warm and dry.  Psychiatric: She has a normal mood and affect. Her behavior is normal. Judgment and thought content normal.   BP 119/72  Pulse 76  Temp(Src) 98.5 F (36.9 C) (Axillary)   Chest x-ray- right lower lobe infiltrate-Preliminary reading by Ronnald Collum, FNP  [facility]      Assessment & Plan:   1. Cough   2. Right lower lobe pneumonia    Meds ordered this encounter  Medications  . ciprofloxacin (CIPRO) 500 MG tablet    Sig: Take 1 tablet (500 mg total) by mouth 2 (two) times daily.    Dispense:  20 tablet    Refill:  0    Order Specific Question:  Supervising Provider    Answer:  Chipper Herb [[id number]]  . cefTRIAXone (ROCEPHIN) injection 1 g    Sig:     Order Specific Question:  Antibiotic Indication:    Answer:  CAP    Keep check of temperature andd SAO2  RTO in 1 month recheck and prn  Mary-Margaret Hassell Done, FNP

## 2014-02-23 NOTE — Patient Instructions (Signed)

## 2014-03-04 ENCOUNTER — Other Ambulatory Visit: Payer: Self-pay | Admitting: Family

## 2014-03-30 DIAGNOSIS — G309 Alzheimer's disease, unspecified: Secondary | ICD-10-CM | POA: Diagnosis not present

## 2014-03-30 DIAGNOSIS — B351 Tinea unguium: Secondary | ICD-10-CM | POA: Diagnosis not present

## 2014-03-30 DIAGNOSIS — F0280 Dementia in other diseases classified elsewhere without behavioral disturbance: Secondary | ICD-10-CM | POA: Diagnosis not present

## 2014-03-30 DIAGNOSIS — M79609 Pain in unspecified limb: Secondary | ICD-10-CM | POA: Diagnosis not present

## 2014-04-07 ENCOUNTER — Encounter: Payer: Self-pay | Admitting: Family Medicine

## 2014-04-07 ENCOUNTER — Ambulatory Visit: Payer: Medicare Other | Admitting: Family Medicine

## 2014-04-07 ENCOUNTER — Telehealth: Payer: Self-pay | Admitting: Nurse Practitioner

## 2014-04-07 ENCOUNTER — Ambulatory Visit (INDEPENDENT_AMBULATORY_CARE_PROVIDER_SITE_OTHER): Payer: Medicare Other | Admitting: Family Medicine

## 2014-04-07 VITALS — BP 103/54 | HR 58 | Temp 98.1°F

## 2014-04-07 DIAGNOSIS — J209 Acute bronchitis, unspecified: Secondary | ICD-10-CM

## 2014-04-07 MED ORDER — LEVOFLOXACIN 500 MG PO TABS: 500.0000 mg | ORAL_TABLET | Freq: Every day | ORAL | Status: DC

## 2014-04-07 NOTE — Telephone Encounter (Signed)
Give call to triage from [facility]

## 2014-04-07 NOTE — Progress Notes (Signed)
   Subjective:    Patient ID: Teresa Douglas, female    DOB: [birth year], 80 y.o.   MRN: [id number]  HPI  This 80 y.o. female presents for evaluation of cough and fever for 2 days.  She has hx of  Dementia and is cared for at a SNF.  Review of Systems C/o cough   No chest pain, SOB, HA, dizziness, vision change, N/V, diarrhea, constipation, dysuria, urinary urgency or frequency, myalgias, arthralgias or rash.  Objective:   Physical Exam  Vital signs noted  Well developed well nourished female.  HEENT - Head atraumatic Normocephalic                Eyes - PERRLA, Conjuctiva - clear Sclera- Clear EOMI                Ears - EAC's Wnl TM's Wnl Gross Hearing WNL                 Throat - oropharanx wnl Respiratory - Lungs with expiratory wheezes bilateral Cardiac - RRR S1 and S2 without murmur GI - Abdomen soft Nontender and bowel sounds active x 4 Extremities - No edema. Neuro - Grossly intact.      Assessment & Plan:  Acute bronchitis, unspecified organism - Plan: levofloxacin (LEVAQUIN) 500 MG tablet One po qd x 7 days.  Okay to use cough medicine on formulary.  Lysbeth Penner FNP

## 2014-06-29 DIAGNOSIS — B351 Tinea unguium: Secondary | ICD-10-CM | POA: Diagnosis not present

## 2014-06-29 DIAGNOSIS — M2010 Hallux valgus (acquired), unspecified foot: Secondary | ICD-10-CM | POA: Diagnosis not present

## 2014-06-29 DIAGNOSIS — M79609 Pain in unspecified limb: Secondary | ICD-10-CM | POA: Diagnosis not present

## 2014-06-29 DIAGNOSIS — R262 Difficulty in walking, not elsewhere classified: Secondary | ICD-10-CM | POA: Diagnosis not present

## 2014-11-10 ENCOUNTER — Ambulatory Visit (INDEPENDENT_AMBULATORY_CARE_PROVIDER_SITE_OTHER): Payer: Medicare Other | Admitting: Family

## 2014-11-10 ENCOUNTER — Encounter: Payer: Self-pay | Admitting: Family

## 2014-11-10 VITALS — BP 104/76 | HR 55 | Temp 97.0°F | Ht 66.0 in | Wt 197.0 lb

## 2014-11-10 DIAGNOSIS — E559 Vitamin D deficiency, unspecified: Secondary | ICD-10-CM

## 2014-11-10 DIAGNOSIS — I10 Essential (primary) hypertension: Secondary | ICD-10-CM

## 2014-11-10 DIAGNOSIS — G309 Alzheimer's disease, unspecified: Secondary | ICD-10-CM | POA: Diagnosis not present

## 2014-11-10 DIAGNOSIS — E079 Disorder of thyroid, unspecified: Secondary | ICD-10-CM | POA: Diagnosis not present

## 2014-11-10 DIAGNOSIS — F0280 Dementia in other diseases classified elsewhere without behavioral disturbance: Secondary | ICD-10-CM

## 2014-11-10 NOTE — Progress Notes (Signed)
   Subjective:    Patient ID: Teresa Douglas, female    DOB: 06-20-1926, 81 y.o.   MRN: [id number]  Pt has extensive Alzheimer's Disease. Pt is a resident of [facility]. Thyroid Problem Presents for follow-up visit. Symptoms include diarrhea. Patient reports no anxiety, constipation, dry skin, hair loss, leg swelling, palpitations, visual change or weight gain. The symptoms have been stable. Past treatments include levothyroxine. The treatment provided significant relief. There is no history of heart failure or hyperlipidemia.  Hypertension This is a chronic problem. The current episode started today. The problem has been resolved since onset. The problem is controlled. Pertinent negatives include no anxiety, headaches, palpitations or shortness of breath. Risk factors for coronary artery disease include obesity and post-menopausal state. Past treatments include beta blockers. The current treatment provides moderate improvement. Hypertensive end-organ damage includes a thyroid problem. There is no history of kidney disease, CAD/MI, CVA or heart failure.      Review of Systems  Constitutional: Negative.  Negative for weight gain.  HENT: Negative.   Eyes: Negative.   Respiratory: Negative.  Negative for shortness of breath.   Cardiovascular: Negative.  Negative for palpitations.  Gastrointestinal: Positive for diarrhea. Negative for constipation.  Endocrine: Negative.   Genitourinary: Negative.   Musculoskeletal: Negative.   Neurological: Negative.  Negative for headaches.  Hematological: Negative.   Psychiatric/Behavioral: Negative.  The patient is not nervous/anxious.   All other systems reviewed and are negative.      Objective:   Physical Exam  Constitutional: She appears well-developed and well-nourished. No distress.  HENT:  Head: Normocephalic and atraumatic.  Right Ear: External ear normal.  Left Ear: External ear normal.  Nose: Nose normal.  Mouth/Throat: Oropharynx is  clear and moist.  Eyes: Pupils are equal, round, and reactive to light.  Neck: Normal range of motion. Neck supple. No thyromegaly present.  Cardiovascular: Normal rate, regular rhythm, normal heart sounds and intact distal pulses.   No murmur heard. Pulmonary/Chest: Effort normal and breath sounds normal. No respiratory distress. She has no wheezes.  Abdominal: Soft. Bowel sounds are normal. She exhibits no distension. There is no tenderness.  Musculoskeletal: Normal range of motion. She exhibits no edema or tenderness.  Neurological: She is alert. She has normal reflexes. She is disoriented. No cranial nerve deficit.  Skin: Skin is warm and dry.  Psychiatric: Her behavior is normal. Judgment and thought content normal. Her affect is inappropriate. Cognition and memory are impaired. She exhibits abnormal recent memory and abnormal remote memory.  Unable to have conversation with pt r/t alzheimers  Vitals reviewed.     BP 104/76 mmHg  Pulse 55  Temp(Src) 97 F (36.1 C) (Oral)  Ht $R'5\' 6"'oe$  (1.676 m)  Wt 197 lb (89.359 kg)  BMI 31.81 kg/m2     Assessment & Plan:  1. Essential hypertension - CMP14+EGFR  2. Thyroid disease  - CMP14+EGFR - Thyroid Panel With TSH  3. Alzheimer's disease  - CMP14+EGFR  4. Vitamin D deficiency - CMP14+EGFR - Vit D  25 hydroxy (rtn osteoporosis monitoring)   Continue all meds Labs pending Unable to review Health Maintenance- Pt poor historian Diet and exercise encouraged RTO 1 year  Evelina Dun, FNP

## 2014-11-10 NOTE — Patient Instructions (Signed)

## 2014-11-11 ENCOUNTER — Other Ambulatory Visit: Payer: Self-pay | Admitting: Family

## 2014-11-11 LAB — CMP14+EGFR
ALBUMIN: 4.2 g/dL (ref 3.5–4.7)
ALK PHOS: 91 IU/L (ref 39–117)
ALT: 9 IU/L (ref 0–32)
AST: 17 IU/L (ref 0–40)
Albumin/Globulin Ratio: 1.5 (ref 1.1–2.5)
BILIRUBIN TOTAL: 0.2 mg/dL (ref 0.0–1.2)
BUN/Creatinine Ratio: 19 (ref 11–26)
BUN: 17 mg/dL (ref 8–27)
CO2: 29 mmol/L (ref 18–29)
CREATININE: 0.9 mg/dL (ref 0.57–1.00)
Calcium: 9.4 mg/dL (ref 8.7–10.3)
Chloride: 99 mmol/L (ref 97–108)
GFR calc Af Amer: 67 mL/min/{1.73_m2} (ref >59)
GFR calc non Af Amer: 58 mL/min/{1.73_m2} — ABNORMAL LOW (ref >59)
Globulin, Total: 2.8 g/dL (ref 1.5–4.5)
Glucose: 93 mg/dL (ref 65–99)
Potassium: 4.4 mmol/L (ref 3.5–5.2)
SODIUM: 142 mmol/L (ref 134–144)
TOTAL PROTEIN: 7 g/dL (ref 6.0–8.5)

## 2014-11-11 LAB — THYROID PANEL WITH TSH
Free Thyroxine Index: 1.9 (ref 1.2–4.9)
T3 UPTAKE RATIO: 32 % (ref 24–39)
T4 TOTAL: 5.9 ug/dL (ref 4.5–12.0)
TSH: 3.07 u[IU]/mL (ref 0.450–4.500)

## 2014-11-11 LAB — VITAMIN D 25 HYDROXY (VIT D DEFICIENCY, FRACTURES): Vit D, 25-Hydroxy: 14.9 ng/mL — ABNORMAL LOW (ref 30.0–100.0)

## 2014-11-11 MED ORDER — VITAMIN D (ERGOCALCIFEROL) 1.25 MG (50000 UNIT) PO CAPS: 50000.0000 [IU] | ORAL_CAPSULE | ORAL | Status: AC

## 2014-11-13 ENCOUNTER — Encounter: Payer: Self-pay | Admitting: Family

## 2015-01-19 DIAGNOSIS — B351 Tinea unguium: Secondary | ICD-10-CM | POA: Diagnosis not present

## 2015-01-19 DIAGNOSIS — M79674 Pain in right toe(s): Secondary | ICD-10-CM | POA: Diagnosis not present

## 2015-01-19 DIAGNOSIS — R262 Difficulty in walking, not elsewhere classified: Secondary | ICD-10-CM | POA: Diagnosis not present

## 2015-01-19 DIAGNOSIS — M79675 Pain in left toe(s): Secondary | ICD-10-CM | POA: Diagnosis not present

## 2015-01-23 IMAGING — CR DG CHEST 1V
1 series · 1 of 1 positions shown · non-contrast
Comparison: 02/05/2012

CLINICAL DATA: Cough.

EXAM:
CHEST - 1 VIEW

[view not recorded]
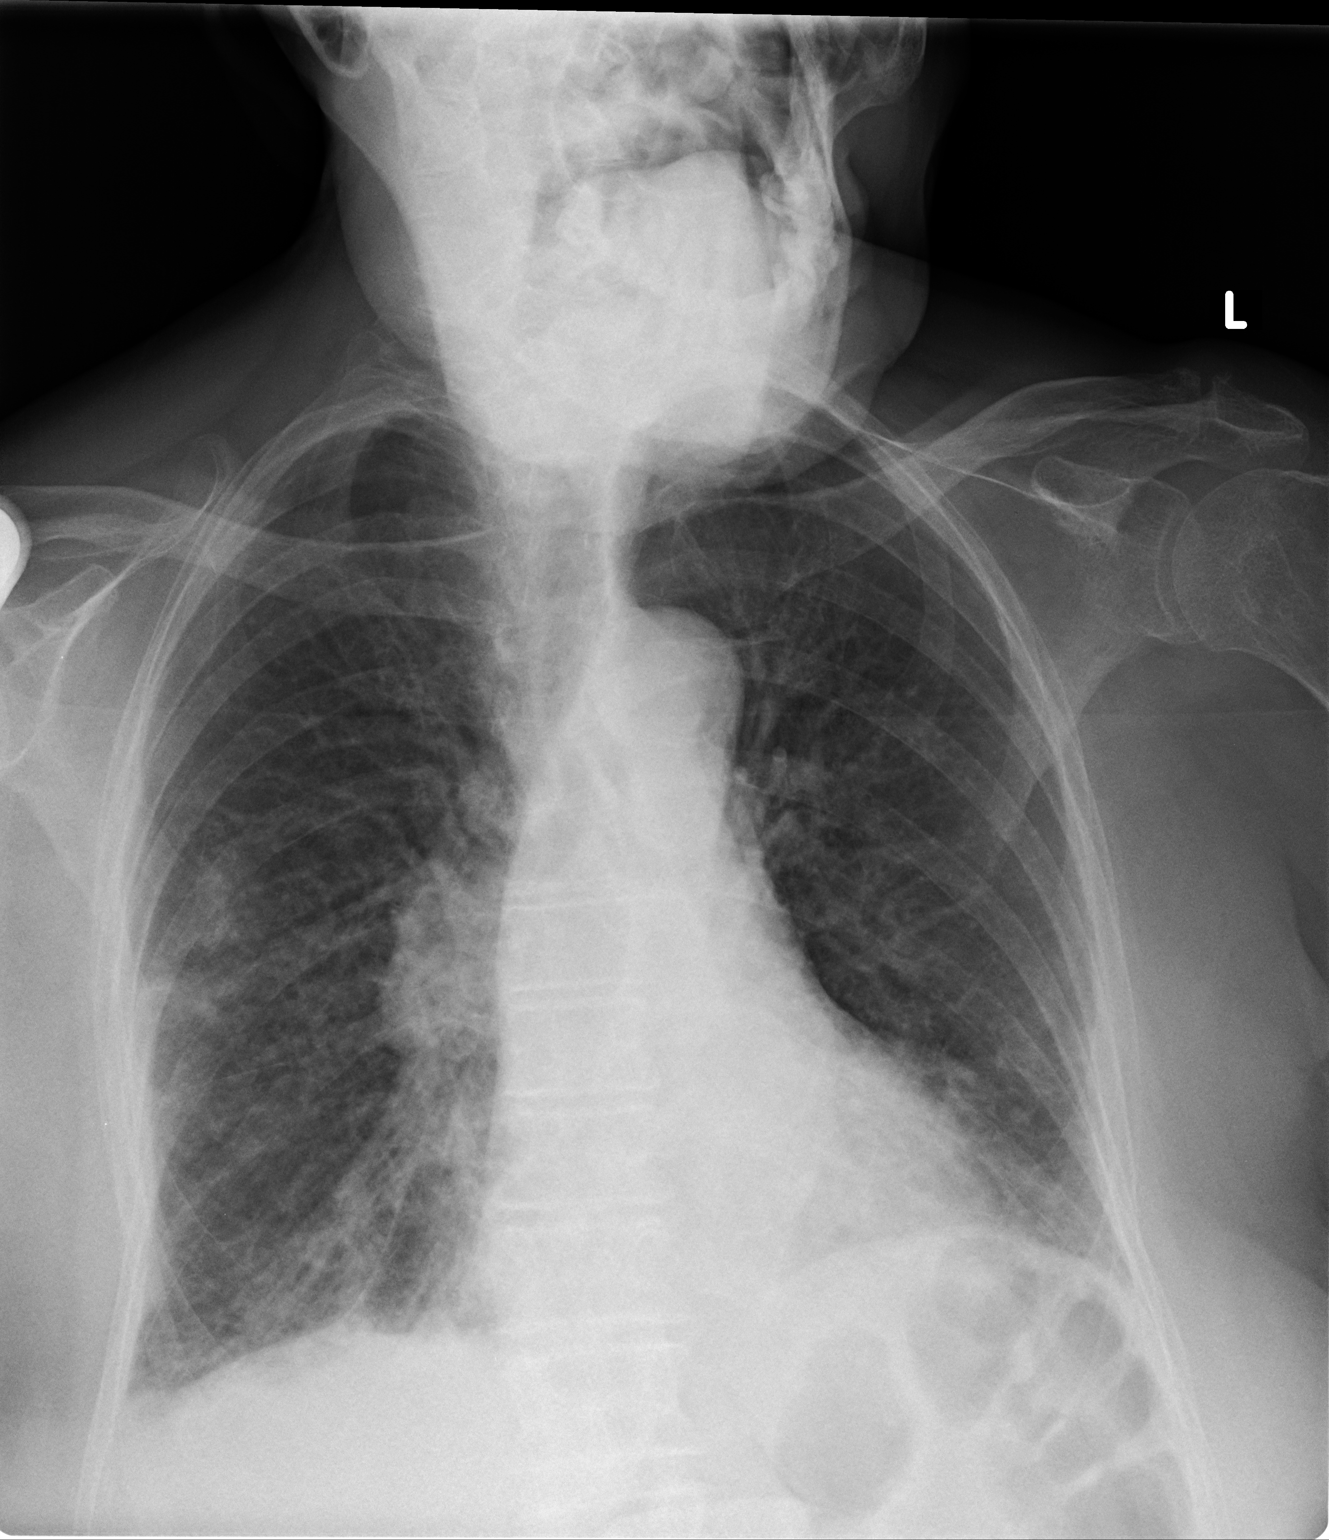

[1 of 1 positions shown; findings below may reference images not displayed]

FINDINGS: Lungs are adequately inflated with patchy mixed interstitial
airspace density within the lung bases and right midlung suggesting
infection. Minimal blunting of the right costophrenic angle likely a
small amount of right pleural fluid. Mild prominence of the right
hilum likely due in part to rotation to the left. Slightly better
aeration in the left lung base. Cardiomediastinal silhouette and
remainder of the exam is unchanged.
IMPRESSION: Patchy mixed interstitial airspace density within the lung bases and
right midlung suggesting infection. Possible small amount of right
pleural fluid. Mild improved aeration left base.

## 2015-03-21 ENCOUNTER — Encounter: Payer: Self-pay | Admitting: Family

## 2015-03-21 ENCOUNTER — Ambulatory Visit (INDEPENDENT_AMBULATORY_CARE_PROVIDER_SITE_OTHER): Payer: Medicare Other | Admitting: Family

## 2015-03-21 VITALS — BP 118/75 | HR 96 | Temp 96.4°F

## 2015-03-21 DIAGNOSIS — J069 Acute upper respiratory infection, unspecified: Secondary | ICD-10-CM

## 2015-03-21 DIAGNOSIS — B070 Plantar wart: Secondary | ICD-10-CM | POA: Diagnosis not present

## 2015-03-21 MED ORDER — BENZONATATE 200 MG PO CAPS: 200.0000 mg | ORAL_CAPSULE | Freq: Three times a day (TID) | ORAL | Status: DC | PRN

## 2015-03-21 MED ORDER — AZITHROMYCIN 250 MG PO TABS: ORAL_TABLET | ORAL | Status: DC

## 2015-03-21 NOTE — Progress Notes (Signed)
Subjective:    Patient ID: Teresa Douglas, female    DOB: [birth year], 81 y.o.   MRN: [id number]  Cough This is a new problem. The current episode started in the past 7 days. The problem has been unchanged. The problem occurs every few minutes. The cough is non-productive. Associated symptoms include nasal congestion, postnasal drip, a rash, rhinorrhea and wheezing. Pertinent negatives include no chills, ear congestion, ear pain, fever, headaches, sore throat or shortness of breath. The symptoms are aggravated by lying down. The treatment provided mild relief. There is no history of asthma or COPD.  Rash This is a new problem. The current episode started in the past 7 days. The problem is unchanged. The affected locations include the left lower leg. The rash is characterized by redness. She was exposed to an insect bite/sting. Associated symptoms include coughing and rhinorrhea. Pertinent negatives include no fever, shortness of breath or sore throat. Past treatments include nothing. The treatment provided no relief. There is no history of asthma.      Review of Systems  Constitutional: Negative.  Negative for fever and chills.  HENT: Positive for postnasal drip and rhinorrhea. Negative for ear pain and sore throat.   Eyes: Negative.   Respiratory: Positive for cough and wheezing. Negative for shortness of breath.   Cardiovascular: Negative.  Negative for palpitations.  Gastrointestinal: Negative.   Endocrine: Negative.   Genitourinary: Negative.   Musculoskeletal: Negative.   Skin: Positive for rash.  Neurological: Negative.  Negative for headaches.  Hematological: Negative.   Psychiatric/Behavioral: Negative.   All other systems reviewed and are negative.      Objective:   Physical Exam  Constitutional: She is oriented to person, place, and time. She appears well-developed and well-nourished. No distress.  Pt has Alzheimers and is unable to answer questions  HENT:  Head:  Normocephalic and atraumatic.  Right Ear: External ear normal.  Left Ear: External ear normal.  Nasal passage erythemas with mild swelling  Oropharynx erythemas  Eyes: Pupils are equal, round, and reactive to light.  Neck: Normal range of motion. Neck supple. No thyromegaly present.  Cardiovascular: Normal rate, regular rhythm, normal heart sounds and intact distal pulses.   No murmur heard. Pulmonary/Chest: Effort normal and breath sounds normal. No respiratory distress. She has no wheezes.  Abdominal: Soft. Bowel sounds are normal. She exhibits no distension. There is no tenderness.  Musculoskeletal: Normal range of motion. She exhibits no edema or tenderness.  Neurological: She is oriented to person, place, and time. She has normal reflexes. No cranial nerve deficit.  Skin: Skin is warm and dry. Lesion (left posterior lower leg) noted.  Psychiatric: She has a normal mood and affect. Her behavior is normal. Judgment and thought content normal.  Vitals reviewed.     BP 118/75 mmHg  Pulse 96  Temp(Src) 96.4 F (35.8 C) (Oral)  Wt      Assessment & Plan:  1. Acute upper respiratory infection -- Take meds as prescribed - Use a cool mist humidifier  -Use saline nose sprays frequently -Saline irrigations of the nose can be very helpful if done frequently.  * 4X daily for 1 week*  * Use of a nettie pot can be helpful with this. Follow directions with this* -Force fluids -For any cough or congestion  Use plain Mucinex- regular strength or max strength is fine   * Children- consult with Pharmacist for dosing -For fever or aces or pains- take tylenol or ibuprofen appropriate for age  and weight.  * for fevers greater than 101 orally you may alternate ibuprofen and tylenol every  3 hours. -Throat lozenges if help - azithromycin (ZITHROMAX) 250 MG tablet; Take 500 mg once, then 250 mg for four days  Dispense: 6 tablet; Refill: 0 - benzonatate (TESSALON) 200 MG capsule; Take 1 capsule  (200 mg total) by mouth 3 (three) times daily as needed.  Dispense: 30 capsule; Refill: 1  2. Plantar wart -Do not pick at -Pt will need to come back to have wart removed -RTO prn   Evelina Dun, FNP

## 2015-03-21 NOTE — Patient Instructions (Addendum)
Upper Respiratory Infection, Adult An upper respiratory infection (URI) is also sometimes known as the common cold. The upper respiratory tract includes the nose, sinuses, throat, trachea, and bronchi. Bronchi are the airways leading to the lungs. Most people improve within 1 week, but symptoms can last up to 2 weeks. A residual cough may last even longer.  CAUSES Many different viruses can infect the tissues lining the upper respiratory tract. The tissues become irritated and inflamed and often become very moist. Mucus production is also common. A cold is contagious. You can easily spread the virus to others by oral contact. This includes kissing, sharing a glass, coughing, or sneezing. Touching your mouth or nose and then touching a surface, which is then touched by another person, can also spread the virus. SYMPTOMS  Symptoms typically develop 1 to 3 days after you come in contact with a cold virus. Symptoms vary from person to person. They may include:  Runny nose.  Sneezing.  Nasal congestion.  Sinus irritation.  Sore throat.  Loss of voice (laryngitis).  Cough.  Fatigue.  Muscle aches.  Loss of appetite.  Headache.  Low-grade fever. DIAGNOSIS  You might diagnose your own cold based on familiar symptoms, since most people get a cold 2 to 3 times a year. Your caregiver can confirm this based on your exam. Most importantly, your caregiver can check that your symptoms are not due to another disease such as strep throat, sinusitis, pneumonia, asthma, or epiglottitis. Blood tests, throat tests, and X-rays are not necessary to diagnose a common cold, but they may sometimes be helpful in excluding other more serious diseases. Your caregiver will decide if any further tests are required. RISKS AND COMPLICATIONS  You may be at risk for a more severe case of the common cold if you smoke cigarettes, have chronic heart disease (such as heart failure) or lung disease (such as asthma), or if  you have a weakened immune system. The very young and very old are also at risk for more serious infections. Bacterial sinusitis, middle ear infections, and bacterial pneumonia can complicate the common cold. The common cold can worsen asthma and chronic obstructive pulmonary disease (COPD). Sometimes, these complications can require emergency medical care and may be life-threatening. PREVENTION  The best way to protect against getting a cold is to practice good hygiene. Avoid oral or hand contact with people with cold symptoms. Wash your hands often if contact occurs. There is no clear evidence that vitamin C, vitamin E, echinacea, or exercise reduces the chance of developing a cold. However, it is always recommended to get plenty of rest and practice good nutrition. TREATMENT  Treatment is directed at relieving symptoms. There is no cure. Antibiotics are not effective, because the infection is caused by a virus, not by bacteria. Treatment may include:  Increased fluid intake. Sports drinks offer valuable electrolytes, sugars, and fluids.  Breathing heated mist or steam (vaporizer or shower).  Eating chicken soup or other clear broths, and maintaining good nutrition.  Getting plenty of rest.  Using gargles or lozenges for comfort.  Controlling fevers with ibuprofen or acetaminophen as directed by your caregiver.  Increasing usage of your inhaler if you have asthma. Zinc gel and zinc lozenges, taken in the first 24 hours of the common cold, can shorten the duration and lessen the severity of symptoms. Pain medicines may help with fever, muscle aches, and throat pain. A variety of non-prescription medicines are available to treat congestion and runny nose. Your caregiver   can make recommendations and may suggest nasal or lung inhalers for other symptoms.  HOME CARE INSTRUCTIONS   Only take over-the-counter or prescription medicines for pain, discomfort, or fever as directed by your  caregiver.  Use a warm mist humidifier or inhale steam from a shower to increase air moisture. This may keep secretions moist and make it easier to breathe.  Drink enough water and fluids to keep your urine clear or pale yellow.  Rest as needed.  Return to work when your temperature has returned to normal or as your caregiver advises. You may need to stay home longer to avoid infecting others. You can also use a face mask and careful hand washing to prevent spread of the virus. SEEK MEDICAL CARE IF:   After the first few days, you feel you are getting worse rather than better.  You need your caregiver's advice about medicines to control symptoms.  You develop chills, worsening shortness of breath, or brown or red sputum. These may be signs of pneumonia.  You develop yellow or brown nasal discharge or pain in the face, especially when you bend forward. These may be signs of sinusitis.  You develop a fever, swollen neck glands, pain with swallowing, or white areas in the back of your throat. These may be signs of strep throat. SEEK IMMEDIATE MEDICAL CARE IF:   You have a fever.  You develop severe or persistent headache, ear pain, sinus pain, or chest pain.  You develop wheezing, a prolonged cough, cough up blood, or have a change in your usual mucus (if you have chronic lung disease).  You develop sore muscles or a stiff neck. Document Released: 03/25/2001 Document Revised: 12/22/2011 Document Reviewed: 01/04/2014 [facility] Patient Information 2015 [place], [place]. This information is not intended to replace advice given to you by your health care provider. Make sure you discuss any questions you have with your health care provider.  - Take meds as prescribed - Use a cool mist humidifier  -Use saline nose sprays frequently -Saline irrigations of the nose can be very helpful if done frequently.  * 4X daily for 1 week*  * Use of a nettie pot can be helpful with this. Follow  directions with this* -Force fluids -For any cough or congestion  Use plain Mucinex- regular strength or max strength is fine   * Children- consult with Pharmacist for dosing -For fever or aces or pains- take tylenol or ibuprofen appropriate for age and weight.  * for fevers greater than 101 orally you may alternate ibuprofen and tylenol every  3 hours. -Throat lozenges if help   Evelina Dun, FNP  Plantar Warts Warts are benign (noncancerous) growths of the outer skin layer. They can occur at any time in life but are most common during childhood and the teen years. Warts can occur on many skin surfaces of the body. When they occur on the underside (sole) of your foot they are called plantar warts. They often emerge in groups with several small warts encircling a larger growth. CAUSES  Human papillomavirus (HPV) is the cause of plantar warts. HPV attacks a break in the skin of the foot. Walking barefoot can lead to exposure to the wart virus. Plantar warts tend to develop over areas of pressure such as the heel and ball of the foot. Plantar warts often grow into the deeper layers of skin. They may spread to other areas of the sole but cannot spread to other areas of the body. SYMPTOMS  You  may also notice a growth on the undersurface of your foot. The wart may grow directly into the sole of the foot, or rise above the surface of the skin on the sole of the foot, or both. They are most often flat from pressure. Warts generally do not cause itching but may cause pain in the area of the wart when you put weight on your foot. DIAGNOSIS  Diagnosis is made by physical examination. This means your caregiver discovers it while examining your foot.  TREATMENT  There are many ways to treat plantar warts. However, warts are very tough. Sometimes it is difficult to treat them so that they go away completely and do not grow back. Any treatment must be done regularly to work. If left untreated, most plantar  warts will eventually disappear over a period of one to two years. Treatments you can do at home include:  Putting duct tape over the top of the wart (occlusion) has been found to be effective over several months. The duct tape should be removed each night and reapplied until the wart has disappeared.  Placing over-the-counter medications on top of the wart to help kill the wart virus and remove the wart tissue (salicylic acid, cantharidin, and dichloroacetic acid) are useful. These are called keratolytic agents. These medications make the skin soft and gradually layers will shed away. These compounds are usually placed on the wart each night and then covered with a bandage. They are also available in premedicated bandage form. Avoid surrounding skin when applying these liquids as these medications can burn healthy skin. The treatment may take several months of nightly use to be effective.  Cryotherapy to freeze the wart has recently become available over-the-counter for children 4 years and older. This system makes use of a soft narrow applicator connected to a bottle of compressed cold liquid that is applied directly to the wart. This medication can burn healthy skin and should be used with caution.  As with all over-the-counter medications, read the directions carefully before use. Treatments generally done in your caregiver's office include:  Some aggressive treatments may cause discomfort, discoloration, and scarring of the surrounding skin. The risks and benefits of treatment should be discussed with your caregiver.  Freezing the wart with liquid nitrogen (cryotherapy, see above).  Burning the wart with use of very high heat (cautery).  Injecting medication into the wart.  Surgically removing or laser treatment of the wart.  Your caregiver may refer you to a dermatologist for difficult to treat large-sized warts or large numbers of warts. HOME CARE INSTRUCTIONS   Soak the affected area  in warm water. Dry the area completely when you are done. Remove the top layer of softened skin, then apply the chosen topical medication and reapply a bandage.  Remove the bandage daily and file excess wart tissue (pumice stone works well for this purpose). Repeat the entire process daily or every other day for weeks until the plantar wart disappears.  Several brands of salicylic acid pads are available as over-the-counter remedies.  Pain can be relieved by wearing a donut bandage. This is a bandage with a hole in it. The bandage is put on with the hole over the wart. This helps take the pressure off the wart and gives pain relief. To help prevent plantar warts:  Wear shoes and socks and change them daily.  Keep feet clean and dry.  Check your feet and your children's feet regularly.  Avoid direct contact with warts on other  people.  Have growths or changes on your skin checked by your caregiver. Document Released: 12/20/2003 Document Revised: 02/13/2014 Document Reviewed: 05/30/2009 [facility] Patient Information 2015 [place], [place]. This information is not intended to replace advice given to you by your health care provider. Make sure you discuss any questions you have with your health care provider.

## 2015-03-27 ENCOUNTER — Ambulatory Visit (INDEPENDENT_AMBULATORY_CARE_PROVIDER_SITE_OTHER): Payer: Medicare Other | Admitting: Physician Assistant

## 2015-03-27 ENCOUNTER — Encounter: Payer: Self-pay | Admitting: Physician Assistant

## 2015-03-27 VITALS — BP 131/77 | HR 77 | Ht 66.0 in

## 2015-03-27 DIAGNOSIS — F419 Anxiety disorder, unspecified: Secondary | ICD-10-CM | POA: Diagnosis not present

## 2015-03-27 DIAGNOSIS — D485 Neoplasm of uncertain behavior of skin: Secondary | ICD-10-CM

## 2015-03-27 MED ORDER — ALPRAZOLAM 1 MG PO TABS: ORAL_TABLET | ORAL | Status: DC

## 2015-03-27 NOTE — Patient Instructions (Signed)
Take Alprazolam 1 hour prior to appointment. Bring other pill in case she needs during visit.  Suggest wheelchair.

## 2015-03-27 NOTE — Progress Notes (Signed)
   Subjective:    Patient ID: Teresa Douglas, female    DOB: 08/15/1926, 81 y.o.   MRN: [id number]  HPI 81 y/o female with severe dementia presents with new appearing and enlarging lesion on posterior left leg x 1 month. She is accompanied by caregiver Patient at [facility]  Skin:       Enlarging knot on back of left leg, negative for pain, bleeding   Psychiatric/Behavioral: Positive for agitation. Behavioral problem: severely demented, easily agitated.       Objective:   Physical Exam  Constitutional:  Very hyperactive, slightly combative, resistant to treatment  Neurological:  Not alert and oriented to person, place, time.  Skin:  Hyperkeratotic nodule on posterior left leg, calf area - suspicious for BCC/SCC. Needs biopsy   Psychiatric:  Severe dementia  Easily agitated. Resists transferring from chair to exam table   Vitals reviewed.         Assessment & Plan:  1. Anxiety  - ALPRAZolam (XANAX) 1 MG tablet; Take 1 pill PO 1 hours prior to appointment. May need other pill for procedure  Dispense: 2 tablet; Refill: 0  2. 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 Needs biopsy to determine treatment plan and diagnosis. Patient is very anxious and not cooperative so I do not feel that biopsy is possible today. I will attempt biopsy in 1 week with xanax 1mg  prior to procedure.    RTO 1 week   Montez Stryker A. Benjamin Stain PA-C

## 2015-04-04 ENCOUNTER — Ambulatory Visit (INDEPENDENT_AMBULATORY_CARE_PROVIDER_SITE_OTHER): Payer: Medicare Other | Admitting: Physician Assistant

## 2015-04-04 ENCOUNTER — Encounter: Payer: Self-pay | Admitting: Physician Assistant

## 2015-04-04 VITALS — BP 91/61 | HR 69 | Ht 66.0 in

## 2015-04-04 DIAGNOSIS — D485 Neoplasm of uncertain behavior of skin: Secondary | ICD-10-CM

## 2015-04-04 DIAGNOSIS — B354 Tinea corporis: Secondary | ICD-10-CM | POA: Diagnosis not present

## 2015-04-04 DIAGNOSIS — C44729 Squamous cell carcinoma of skin of left lower limb, including hip: Secondary | ICD-10-CM | POA: Diagnosis not present

## 2015-04-04 NOTE — Progress Notes (Signed)
   Subjective:    Patient ID: Royal Hawthorn, female    DOB: 10/16/1925, 81 y.o.   MRN: [id number]  HPI 81 y/o female with comorbid dementia  presents for follow up of nonhealing lesion on left posterior calf. She is accompanied by two CNAs from [facility]. She has took 1mg  Alprazolam 1 hour prior to preduere, which has eased the anxity and combativeness.     Review of Systems  Skin:       Worsening, nonhealing lesion on posterior left leg x several weeks  All other systems reviewed and are negative.      Objective:   Physical Exam  Constitutional:  In wheelchair. Very drowsy but talkative. In and out of sleeping episodes. Cooperative with exam   Skin:  Hyperkeratotic nodule on posterior LL calf with central excoriation suspcious for BCC/SCC   Nursing note and vitals reviewed.         Assessment & Plan:  1. Neoplasm of uncertain behavior of skin - >1.0cm Lesion bx'd to r/o BCC/SCC  Await path and notify paitient accordingly  - Pathology Report   Apply Mupirocin BID to AA x 14 days. Change dressing daily. Wash with dove soap or mild soap.   Nikeshia Keetch A. Benjamin Stain PA-C

## 2015-04-07 ENCOUNTER — Encounter: Payer: Self-pay | Admitting: Physician Assistant

## 2015-04-12 LAB — PATHOLOGY

## 2015-05-04 DIAGNOSIS — M79675 Pain in left toe(s): Secondary | ICD-10-CM | POA: Diagnosis not present

## 2015-05-04 DIAGNOSIS — B351 Tinea unguium: Secondary | ICD-10-CM | POA: Diagnosis not present

## 2015-05-04 DIAGNOSIS — R262 Difficulty in walking, not elsewhere classified: Secondary | ICD-10-CM | POA: Diagnosis not present

## 2015-05-04 DIAGNOSIS — M79674 Pain in right toe(s): Secondary | ICD-10-CM | POA: Diagnosis not present

## 2015-07-26 ENCOUNTER — Encounter: Payer: Self-pay | Admitting: Family Medicine

## 2015-07-26 ENCOUNTER — Ambulatory Visit (INDEPENDENT_AMBULATORY_CARE_PROVIDER_SITE_OTHER): Payer: Medicare Other | Admitting: Family Medicine

## 2015-07-26 VITALS — BP 100/63 | HR 57 | Temp 97.1°F | Ht 66.0 in

## 2015-07-26 DIAGNOSIS — J209 Acute bronchitis, unspecified: Secondary | ICD-10-CM | POA: Diagnosis not present

## 2015-07-26 MED ORDER — AZITHROMYCIN 250 MG PO TABS: ORAL_TABLET | ORAL | Status: DC

## 2015-07-26 NOTE — Progress Notes (Signed)
BP 100/63 mmHg  Pulse 57  Temp(Src) 97.1 F (36.2 C) (Axillary)  Ht 5\' 6"  (1.676 m)  Wt    Subjective:    Patient ID: Royal Hawthorn, female    DOB: [birth year], 81 y.o.   MRN: [id number]  HPI: NOLAN LASSER is a 81 y.o. female presenting on 07/26/2015 for Cough and Chest congestion   HPI Congestion She is been having increased congestion for the past week. They haven't noticed any runny nose but she has had an increased cough. Patient has severe dementia and most of the history is provided by the transport caretakers. They have not noted any fevers or chills. In have not noticed her coughing anything up.  Relevant past medical, surgical, family and social history reviewed and updated as indicated. Interim medical history since our last visit reviewed. Allergies and medications reviewed and updated.  Review of Systems  Unable to perform ROS: Dementia (Full ROS is difficult to obtain because of dementia)  Constitutional: Negative for fever.  HENT: Positive for congestion.   Respiratory: Positive for cough. Negative for shortness of breath.     Per HPI unless specifically indicated above     Medication List       This list is accurate as of: 07/26/15  2:30 PM.  Always use your most recent med list.               acetaminophen 500 MG tablet  Commonly known as:  TYLENOL  Take 1,000 mg by mouth every 6 (six) hours as needed.     ALPRAZolam 1 MG tablet  Commonly known as:  XANAX  Take 1 pill PO 1 hours prior to appointment. May need other pill for procedure     atenolol 25 MG tablet  Commonly known as:  TENORMIN  Take 25 mg by mouth daily.     azithromycin 250 MG tablet  Commonly known as:  ZITHROMAX  Take 2 the first day and then one each day after.     benzonatate 200 MG capsule  Commonly known as:  TESSALON  Take 1 capsule (200 mg total) by mouth 3 (three) times daily as needed.     donepezil 23 MG Tabs tablet  Commonly known as:  ARICEPT  Take 1 tablet  (23 mg total) by mouth at bedtime.     IMODIUM PO  Take by mouth.     levothyroxine 50 MCG tablet  Commonly known as:  SYNTHROID, LEVOTHROID  Take 50 mcg by mouth daily before breakfast.     Vitamin D (Ergocalciferol) 50000 UNITS Caps capsule  Commonly known as:  DRISDOL  Take 1 capsule (50,000 Units total) by mouth every 7 (seven) days.           Objective:    BP 100/63 mmHg  Pulse 57  Temp(Src) 97.1 F (36.2 C) (Axillary)  Ht 5\' 6"  (1.676 m)  Wt   Wt Readings from Last 3 Encounters:  11/10/14 197 lb (89.359 kg)  01/19/14 182 lb (82.555 kg)  01/09/14 187 lb (84.823 kg)    Physical Exam  Constitutional: She appears well-developed and well-nourished. No distress.  HENT:  Right Ear: Tympanic membrane, external ear and ear canal normal.  Left Ear: Tympanic membrane, external ear and ear canal normal.  Nose: Mucosal edema and rhinorrhea present. No epistaxis.  Mouth/Throat: Mucous membranes are normal.  Unable to obtain full exam because difficult to get patient to open her mouth and see her posterior oropharynx  Eyes: Conjunctivae  are normal. Pupils are equal, round, and reactive to light.  Neck: Neck supple.  Cardiovascular: Normal rate, regular rhythm, normal heart sounds and intact distal pulses.   No murmur heard. Pulmonary/Chest: Effort normal and breath sounds normal. No respiratory distress. She has no wheezes.  Lymphadenopathy:    She has no cervical adenopathy.  Skin: Skin is warm and dry. No rash noted. She is not diaphoretic.  Psychiatric: She has a normal mood and affect. Her behavior is normal.  Vitals reviewed.   Results for orders placed or performed in visit on 04/04/15  Pathology Report  Result Value Ref Range   PATH REPORT.SITE OF ORIGIN SPEC Comment    . Comment    PATH REPORT.FINAL DX SPEC Comment    SIGNED OUT BY: Comment    GROSS DESCRIPTION: Comment    . Comment    PAYMENT PROCEDURE Comment       Assessment & Plan:   Problem List  Items Addressed This Visit    None    Visit Diagnoses    Acute bronchitis, unspecified organism    -  Primary    Relevant Medications    azithromycin (ZITHROMAX) 250 MG tablet        Follow up plan: Return if symptoms worsen or fail to improve.  Caryl Pina, MD [facility] 07/26/2015, 2:30 PM

## 2015-08-13 DIAGNOSIS — M79672 Pain in left foot: Secondary | ICD-10-CM | POA: Diagnosis not present

## 2015-08-13 DIAGNOSIS — M79671 Pain in right foot: Secondary | ICD-10-CM | POA: Diagnosis not present

## 2015-08-13 DIAGNOSIS — B351 Tinea unguium: Secondary | ICD-10-CM | POA: Diagnosis not present

## 2015-10-29 DIAGNOSIS — M79672 Pain in left foot: Secondary | ICD-10-CM | POA: Diagnosis not present

## 2015-10-29 DIAGNOSIS — R262 Difficulty in walking, not elsewhere classified: Secondary | ICD-10-CM | POA: Diagnosis not present

## 2015-10-29 DIAGNOSIS — M79671 Pain in right foot: Secondary | ICD-10-CM | POA: Diagnosis not present

## 2015-10-29 DIAGNOSIS — B351 Tinea unguium: Secondary | ICD-10-CM | POA: Diagnosis not present

## 2015-12-21 ENCOUNTER — Other Ambulatory Visit: Payer: Self-pay | Admitting: *Deleted

## 2015-12-21 DIAGNOSIS — F419 Anxiety disorder, unspecified: Secondary | ICD-10-CM

## 2015-12-21 MED ORDER — ALPRAZOLAM 1 MG PO TABS: ORAL_TABLET | ORAL | Status: AC

## 2016-01-28 DIAGNOSIS — M6281 Muscle weakness (generalized): Secondary | ICD-10-CM | POA: Diagnosis not present

## 2016-01-28 DIAGNOSIS — R279 Unspecified lack of coordination: Secondary | ICD-10-CM | POA: Diagnosis not present

## 2016-01-28 DIAGNOSIS — R3981 Functional urinary incontinence: Secondary | ICD-10-CM | POA: Diagnosis not present

## 2016-01-28 DIAGNOSIS — F411 Generalized anxiety disorder: Secondary | ICD-10-CM | POA: Diagnosis not present

## 2016-01-29 DIAGNOSIS — M6281 Muscle weakness (generalized): Secondary | ICD-10-CM | POA: Diagnosis not present

## 2016-01-29 DIAGNOSIS — R279 Unspecified lack of coordination: Secondary | ICD-10-CM | POA: Diagnosis not present

## 2016-01-29 DIAGNOSIS — R3981 Functional urinary incontinence: Secondary | ICD-10-CM | POA: Diagnosis not present

## 2016-01-29 DIAGNOSIS — F411 Generalized anxiety disorder: Secondary | ICD-10-CM | POA: Diagnosis not present

## 2016-02-01 DIAGNOSIS — B351 Tinea unguium: Secondary | ICD-10-CM | POA: Diagnosis not present

## 2016-02-01 DIAGNOSIS — M79675 Pain in left toe(s): Secondary | ICD-10-CM | POA: Diagnosis not present

## 2016-02-01 DIAGNOSIS — M79674 Pain in right toe(s): Secondary | ICD-10-CM | POA: Diagnosis not present

## 2016-02-01 DIAGNOSIS — I739 Peripheral vascular disease, unspecified: Secondary | ICD-10-CM | POA: Diagnosis not present

## 2016-02-04 DIAGNOSIS — R000 Tachycardia, unspecified: Secondary | ICD-10-CM | POA: Diagnosis not present

## 2016-02-04 DIAGNOSIS — G301 Alzheimer's disease with late onset: Secondary | ICD-10-CM | POA: Diagnosis not present

## 2016-02-04 DIAGNOSIS — R3981 Functional urinary incontinence: Secondary | ICD-10-CM | POA: Diagnosis not present

## 2016-02-05 DIAGNOSIS — F411 Generalized anxiety disorder: Secondary | ICD-10-CM | POA: Diagnosis not present

## 2016-02-05 DIAGNOSIS — R279 Unspecified lack of coordination: Secondary | ICD-10-CM | POA: Diagnosis not present

## 2016-02-05 DIAGNOSIS — R3981 Functional urinary incontinence: Secondary | ICD-10-CM | POA: Diagnosis not present

## 2016-02-05 DIAGNOSIS — M6281 Muscle weakness (generalized): Secondary | ICD-10-CM | POA: Diagnosis not present

## 2016-02-06 DIAGNOSIS — R000 Tachycardia, unspecified: Secondary | ICD-10-CM | POA: Diagnosis not present

## 2016-02-06 DIAGNOSIS — R3981 Functional urinary incontinence: Secondary | ICD-10-CM | POA: Diagnosis not present

## 2016-02-06 DIAGNOSIS — R279 Unspecified lack of coordination: Secondary | ICD-10-CM | POA: Diagnosis not present

## 2016-02-06 DIAGNOSIS — F411 Generalized anxiety disorder: Secondary | ICD-10-CM | POA: Diagnosis not present

## 2016-02-06 DIAGNOSIS — M6281 Muscle weakness (generalized): Secondary | ICD-10-CM | POA: Diagnosis not present

## 2016-02-06 DIAGNOSIS — G301 Alzheimer's disease with late onset: Secondary | ICD-10-CM | POA: Diagnosis not present

## 2016-02-07 DIAGNOSIS — R3981 Functional urinary incontinence: Secondary | ICD-10-CM | POA: Diagnosis not present

## 2016-02-07 DIAGNOSIS — G301 Alzheimer's disease with late onset: Secondary | ICD-10-CM | POA: Diagnosis not present

## 2016-02-07 DIAGNOSIS — R000 Tachycardia, unspecified: Secondary | ICD-10-CM | POA: Diagnosis not present

## 2016-02-08 DIAGNOSIS — R3981 Functional urinary incontinence: Secondary | ICD-10-CM | POA: Diagnosis not present

## 2016-02-08 DIAGNOSIS — G301 Alzheimer's disease with late onset: Secondary | ICD-10-CM | POA: Diagnosis not present

## 2016-02-08 DIAGNOSIS — R000 Tachycardia, unspecified: Secondary | ICD-10-CM | POA: Diagnosis not present

## 2016-02-11 DIAGNOSIS — F411 Generalized anxiety disorder: Secondary | ICD-10-CM | POA: Diagnosis not present

## 2016-02-11 DIAGNOSIS — R279 Unspecified lack of coordination: Secondary | ICD-10-CM | POA: Diagnosis not present

## 2016-02-11 DIAGNOSIS — G301 Alzheimer's disease with late onset: Secondary | ICD-10-CM | POA: Diagnosis not present

## 2016-02-11 DIAGNOSIS — R3981 Functional urinary incontinence: Secondary | ICD-10-CM | POA: Diagnosis not present

## 2016-02-11 DIAGNOSIS — R000 Tachycardia, unspecified: Secondary | ICD-10-CM | POA: Diagnosis not present

## 2016-02-11 DIAGNOSIS — M6281 Muscle weakness (generalized): Secondary | ICD-10-CM | POA: Diagnosis not present

## 2016-02-12 DIAGNOSIS — R3981 Functional urinary incontinence: Secondary | ICD-10-CM | POA: Diagnosis not present

## 2016-02-12 DIAGNOSIS — F411 Generalized anxiety disorder: Secondary | ICD-10-CM | POA: Diagnosis not present

## 2016-02-12 DIAGNOSIS — M6281 Muscle weakness (generalized): Secondary | ICD-10-CM | POA: Diagnosis not present

## 2016-02-12 DIAGNOSIS — R000 Tachycardia, unspecified: Secondary | ICD-10-CM | POA: Diagnosis not present

## 2016-02-12 DIAGNOSIS — R279 Unspecified lack of coordination: Secondary | ICD-10-CM | POA: Diagnosis not present

## 2016-02-12 DIAGNOSIS — G301 Alzheimer's disease with late onset: Secondary | ICD-10-CM | POA: Diagnosis not present

## 2016-02-13 DIAGNOSIS — R279 Unspecified lack of coordination: Secondary | ICD-10-CM | POA: Diagnosis not present

## 2016-02-13 DIAGNOSIS — M6281 Muscle weakness (generalized): Secondary | ICD-10-CM | POA: Diagnosis not present

## 2016-02-13 DIAGNOSIS — F411 Generalized anxiety disorder: Secondary | ICD-10-CM | POA: Diagnosis not present

## 2016-02-13 DIAGNOSIS — R3981 Functional urinary incontinence: Secondary | ICD-10-CM | POA: Diagnosis not present

## 2016-02-14 DIAGNOSIS — M6281 Muscle weakness (generalized): Secondary | ICD-10-CM | POA: Diagnosis not present

## 2016-02-14 DIAGNOSIS — R279 Unspecified lack of coordination: Secondary | ICD-10-CM | POA: Diagnosis not present

## 2016-02-14 DIAGNOSIS — G301 Alzheimer's disease with late onset: Secondary | ICD-10-CM | POA: Diagnosis not present

## 2016-02-14 DIAGNOSIS — R3981 Functional urinary incontinence: Secondary | ICD-10-CM | POA: Diagnosis not present

## 2016-02-14 DIAGNOSIS — R000 Tachycardia, unspecified: Secondary | ICD-10-CM | POA: Diagnosis not present

## 2016-02-14 DIAGNOSIS — F411 Generalized anxiety disorder: Secondary | ICD-10-CM | POA: Diagnosis not present

## 2016-02-15 DIAGNOSIS — R3981 Functional urinary incontinence: Secondary | ICD-10-CM | POA: Diagnosis not present

## 2016-02-15 DIAGNOSIS — G301 Alzheimer's disease with late onset: Secondary | ICD-10-CM | POA: Diagnosis not present

## 2016-02-15 DIAGNOSIS — R000 Tachycardia, unspecified: Secondary | ICD-10-CM | POA: Diagnosis not present

## 2016-02-18 DIAGNOSIS — M6281 Muscle weakness (generalized): Secondary | ICD-10-CM | POA: Diagnosis not present

## 2016-02-18 DIAGNOSIS — F411 Generalized anxiety disorder: Secondary | ICD-10-CM | POA: Diagnosis not present

## 2016-02-18 DIAGNOSIS — R3981 Functional urinary incontinence: Secondary | ICD-10-CM | POA: Diagnosis not present

## 2016-02-18 DIAGNOSIS — R000 Tachycardia, unspecified: Secondary | ICD-10-CM | POA: Diagnosis not present

## 2016-02-18 DIAGNOSIS — G301 Alzheimer's disease with late onset: Secondary | ICD-10-CM | POA: Diagnosis not present

## 2016-02-18 DIAGNOSIS — R279 Unspecified lack of coordination: Secondary | ICD-10-CM | POA: Diagnosis not present

## 2016-02-19 DIAGNOSIS — G301 Alzheimer's disease with late onset: Secondary | ICD-10-CM | POA: Diagnosis not present

## 2016-02-19 DIAGNOSIS — M6281 Muscle weakness (generalized): Secondary | ICD-10-CM | POA: Diagnosis not present

## 2016-02-19 DIAGNOSIS — R000 Tachycardia, unspecified: Secondary | ICD-10-CM | POA: Diagnosis not present

## 2016-02-19 DIAGNOSIS — F411 Generalized anxiety disorder: Secondary | ICD-10-CM | POA: Diagnosis not present

## 2016-02-19 DIAGNOSIS — R279 Unspecified lack of coordination: Secondary | ICD-10-CM | POA: Diagnosis not present

## 2016-02-19 DIAGNOSIS — R3981 Functional urinary incontinence: Secondary | ICD-10-CM | POA: Diagnosis not present

## 2016-02-20 DIAGNOSIS — R3981 Functional urinary incontinence: Secondary | ICD-10-CM | POA: Diagnosis not present

## 2016-02-20 DIAGNOSIS — M6281 Muscle weakness (generalized): Secondary | ICD-10-CM | POA: Diagnosis not present

## 2016-02-20 DIAGNOSIS — R279 Unspecified lack of coordination: Secondary | ICD-10-CM | POA: Diagnosis not present

## 2016-02-20 DIAGNOSIS — F411 Generalized anxiety disorder: Secondary | ICD-10-CM | POA: Diagnosis not present

## 2016-02-21 DIAGNOSIS — R279 Unspecified lack of coordination: Secondary | ICD-10-CM | POA: Diagnosis not present

## 2016-02-21 DIAGNOSIS — G301 Alzheimer's disease with late onset: Secondary | ICD-10-CM | POA: Diagnosis not present

## 2016-02-21 DIAGNOSIS — M6281 Muscle weakness (generalized): Secondary | ICD-10-CM | POA: Diagnosis not present

## 2016-02-21 DIAGNOSIS — R3981 Functional urinary incontinence: Secondary | ICD-10-CM | POA: Diagnosis not present

## 2016-02-21 DIAGNOSIS — F411 Generalized anxiety disorder: Secondary | ICD-10-CM | POA: Diagnosis not present

## 2016-02-21 DIAGNOSIS — R000 Tachycardia, unspecified: Secondary | ICD-10-CM | POA: Diagnosis not present

## 2016-02-25 DIAGNOSIS — R3981 Functional urinary incontinence: Secondary | ICD-10-CM | POA: Diagnosis not present

## 2016-02-25 DIAGNOSIS — R279 Unspecified lack of coordination: Secondary | ICD-10-CM | POA: Diagnosis not present

## 2016-02-25 DIAGNOSIS — M6281 Muscle weakness (generalized): Secondary | ICD-10-CM | POA: Diagnosis not present

## 2016-02-25 DIAGNOSIS — F411 Generalized anxiety disorder: Secondary | ICD-10-CM | POA: Diagnosis not present

## 2016-02-26 DIAGNOSIS — M6281 Muscle weakness (generalized): Secondary | ICD-10-CM | POA: Diagnosis not present

## 2016-02-26 DIAGNOSIS — G301 Alzheimer's disease with late onset: Secondary | ICD-10-CM | POA: Diagnosis not present

## 2016-02-26 DIAGNOSIS — R279 Unspecified lack of coordination: Secondary | ICD-10-CM | POA: Diagnosis not present

## 2016-02-26 DIAGNOSIS — R3981 Functional urinary incontinence: Secondary | ICD-10-CM | POA: Diagnosis not present

## 2016-02-26 DIAGNOSIS — F411 Generalized anxiety disorder: Secondary | ICD-10-CM | POA: Diagnosis not present

## 2016-02-26 DIAGNOSIS — R000 Tachycardia, unspecified: Secondary | ICD-10-CM | POA: Diagnosis not present

## 2016-02-27 DIAGNOSIS — F411 Generalized anxiety disorder: Secondary | ICD-10-CM | POA: Diagnosis not present

## 2016-02-27 DIAGNOSIS — R3981 Functional urinary incontinence: Secondary | ICD-10-CM | POA: Diagnosis not present

## 2016-02-27 DIAGNOSIS — G301 Alzheimer's disease with late onset: Secondary | ICD-10-CM | POA: Diagnosis not present

## 2016-02-27 DIAGNOSIS — R000 Tachycardia, unspecified: Secondary | ICD-10-CM | POA: Diagnosis not present

## 2016-02-27 DIAGNOSIS — M6281 Muscle weakness (generalized): Secondary | ICD-10-CM | POA: Diagnosis not present

## 2016-02-27 DIAGNOSIS — R279 Unspecified lack of coordination: Secondary | ICD-10-CM | POA: Diagnosis not present

## 2016-02-28 DIAGNOSIS — R3981 Functional urinary incontinence: Secondary | ICD-10-CM | POA: Diagnosis not present

## 2016-02-28 DIAGNOSIS — R000 Tachycardia, unspecified: Secondary | ICD-10-CM | POA: Diagnosis not present

## 2016-02-28 DIAGNOSIS — R279 Unspecified lack of coordination: Secondary | ICD-10-CM | POA: Diagnosis not present

## 2016-02-28 DIAGNOSIS — G301 Alzheimer's disease with late onset: Secondary | ICD-10-CM | POA: Diagnosis not present

## 2016-02-28 DIAGNOSIS — M6281 Muscle weakness (generalized): Secondary | ICD-10-CM | POA: Diagnosis not present

## 2016-02-28 DIAGNOSIS — F411 Generalized anxiety disorder: Secondary | ICD-10-CM | POA: Diagnosis not present

## 2016-02-29 DIAGNOSIS — R000 Tachycardia, unspecified: Secondary | ICD-10-CM | POA: Diagnosis not present

## 2016-02-29 DIAGNOSIS — R3981 Functional urinary incontinence: Secondary | ICD-10-CM | POA: Diagnosis not present

## 2016-02-29 DIAGNOSIS — G301 Alzheimer's disease with late onset: Secondary | ICD-10-CM | POA: Diagnosis not present

## 2016-03-03 DIAGNOSIS — G301 Alzheimer's disease with late onset: Secondary | ICD-10-CM | POA: Diagnosis not present

## 2016-03-03 DIAGNOSIS — R000 Tachycardia, unspecified: Secondary | ICD-10-CM | POA: Diagnosis not present

## 2016-03-03 DIAGNOSIS — F411 Generalized anxiety disorder: Secondary | ICD-10-CM | POA: Diagnosis not present

## 2016-03-03 DIAGNOSIS — R279 Unspecified lack of coordination: Secondary | ICD-10-CM | POA: Diagnosis not present

## 2016-03-03 DIAGNOSIS — M6281 Muscle weakness (generalized): Secondary | ICD-10-CM | POA: Diagnosis not present

## 2016-03-03 DIAGNOSIS — R3981 Functional urinary incontinence: Secondary | ICD-10-CM | POA: Diagnosis not present

## 2016-03-04 DIAGNOSIS — F411 Generalized anxiety disorder: Secondary | ICD-10-CM | POA: Diagnosis not present

## 2016-03-04 DIAGNOSIS — R000 Tachycardia, unspecified: Secondary | ICD-10-CM | POA: Diagnosis not present

## 2016-03-04 DIAGNOSIS — G301 Alzheimer's disease with late onset: Secondary | ICD-10-CM | POA: Diagnosis not present

## 2016-03-04 DIAGNOSIS — R279 Unspecified lack of coordination: Secondary | ICD-10-CM | POA: Diagnosis not present

## 2016-03-04 DIAGNOSIS — R3981 Functional urinary incontinence: Secondary | ICD-10-CM | POA: Diagnosis not present

## 2016-03-04 DIAGNOSIS — M6281 Muscle weakness (generalized): Secondary | ICD-10-CM | POA: Diagnosis not present

## 2016-03-05 DIAGNOSIS — G301 Alzheimer's disease with late onset: Secondary | ICD-10-CM | POA: Diagnosis not present

## 2016-03-05 DIAGNOSIS — R3981 Functional urinary incontinence: Secondary | ICD-10-CM | POA: Diagnosis not present

## 2016-03-05 DIAGNOSIS — R000 Tachycardia, unspecified: Secondary | ICD-10-CM | POA: Diagnosis not present

## 2016-03-05 DIAGNOSIS — R279 Unspecified lack of coordination: Secondary | ICD-10-CM | POA: Diagnosis not present

## 2016-03-05 DIAGNOSIS — M6281 Muscle weakness (generalized): Secondary | ICD-10-CM | POA: Diagnosis not present

## 2016-03-05 DIAGNOSIS — F411 Generalized anxiety disorder: Secondary | ICD-10-CM | POA: Diagnosis not present

## 2016-03-06 DIAGNOSIS — M6281 Muscle weakness (generalized): Secondary | ICD-10-CM | POA: Diagnosis not present

## 2016-03-06 DIAGNOSIS — R3981 Functional urinary incontinence: Secondary | ICD-10-CM | POA: Diagnosis not present

## 2016-03-06 DIAGNOSIS — R000 Tachycardia, unspecified: Secondary | ICD-10-CM | POA: Diagnosis not present

## 2016-03-06 DIAGNOSIS — F411 Generalized anxiety disorder: Secondary | ICD-10-CM | POA: Diagnosis not present

## 2016-03-06 DIAGNOSIS — G301 Alzheimer's disease with late onset: Secondary | ICD-10-CM | POA: Diagnosis not present

## 2016-03-06 DIAGNOSIS — R279 Unspecified lack of coordination: Secondary | ICD-10-CM | POA: Diagnosis not present

## 2016-03-25 ENCOUNTER — Telehealth: Payer: Self-pay | Admitting: *Deleted

## 2016-03-25 NOTE — Telephone Encounter (Signed)
[place] had left for the day, so I spoke with Kia and advised her of Christy's advice.

## 2016-03-25 NOTE — Telephone Encounter (Signed)
Rest, Ice, and keep elevated when possible. Pt will need to be seen if pain or swelling worsen

## 2016-05-02 DIAGNOSIS — R262 Difficulty in walking, not elsewhere classified: Secondary | ICD-10-CM | POA: Diagnosis not present

## 2016-05-02 DIAGNOSIS — M79671 Pain in right foot: Secondary | ICD-10-CM | POA: Diagnosis not present

## 2016-05-02 DIAGNOSIS — B351 Tinea unguium: Secondary | ICD-10-CM | POA: Diagnosis not present

## 2016-05-02 DIAGNOSIS — M79672 Pain in left foot: Secondary | ICD-10-CM | POA: Diagnosis not present

## 2016-05-15 ENCOUNTER — Ambulatory Visit (INDEPENDENT_AMBULATORY_CARE_PROVIDER_SITE_OTHER): Payer: Medicare Other | Admitting: Family

## 2016-05-15 ENCOUNTER — Encounter: Payer: Self-pay | Admitting: Family

## 2016-05-15 VITALS — BP 108/67 | HR 69 | Temp 96.3°F

## 2016-05-15 DIAGNOSIS — I10 Essential (primary) hypertension: Secondary | ICD-10-CM

## 2016-05-15 DIAGNOSIS — F0280 Dementia in other diseases classified elsewhere without behavioral disturbance: Secondary | ICD-10-CM | POA: Diagnosis not present

## 2016-05-15 DIAGNOSIS — E559 Vitamin D deficiency, unspecified: Secondary | ICD-10-CM | POA: Diagnosis not present

## 2016-05-15 DIAGNOSIS — G309 Alzheimer's disease, unspecified: Secondary | ICD-10-CM

## 2016-05-15 DIAGNOSIS — R451 Restlessness and agitation: Secondary | ICD-10-CM | POA: Diagnosis not present

## 2016-05-15 DIAGNOSIS — E079 Disorder of thyroid, unspecified: Secondary | ICD-10-CM | POA: Diagnosis not present

## 2016-05-15 NOTE — Progress Notes (Signed)
   Subjective:    Patient ID: Teresa Douglas, female    DOB: 20-Dec-1925, 82 y.o.   MRN: [id number]  Pt has extensive Alzheimer's Disease. Pt is a resident of [facility] and a caregiver is in room. Thyroid Problem  Presents for follow-up visit. Symptoms include diarrhea and fatigue. Patient reports no anxiety, constipation, dry skin, hair loss, leg swelling, palpitations, visual change or weight gain. The symptoms have been stable. Past treatments include levothyroxine. The treatment provided significant relief. There is no history of heart failure or hyperlipidemia.  Hypertension  This is a chronic problem. The current episode started today. The problem has been resolved since onset. The problem is controlled. Pertinent negatives include no anxiety, headaches, palpitations or shortness of breath. Risk factors for coronary artery disease include obesity and post-menopausal state. Past treatments include beta blockers. The current treatment provides moderate improvement. Hypertensive end-organ damage includes a thyroid problem. There is no history of kidney disease, CAD/MI, CVA or heart failure.      Review of Systems  Constitutional: Positive for fatigue. Negative for weight gain.  HENT: Negative.   Eyes: Negative.   Respiratory: Negative.  Negative for shortness of breath.   Cardiovascular: Negative.  Negative for palpitations.  Gastrointestinal: Positive for diarrhea. Negative for constipation.  Endocrine: Negative.   Genitourinary: Negative.   Musculoskeletal: Negative.   Neurological: Negative.  Negative for headaches.  Hematological: Negative.   Psychiatric/Behavioral: Positive for agitation and behavioral problems. The patient is not nervous/anxious.   All other systems reviewed and are negative.      Objective:   Physical Exam  Constitutional: She appears well-developed and well-nourished. No distress.  HENT:  Head: Normocephalic and atraumatic.  Right Ear: External ear  normal.  Left Ear: External ear normal.  Nose: Nose normal.  Mouth/Throat: Oropharynx is clear and moist.  Eyes: Pupils are equal, round, and reactive to light.  Neck: Normal range of motion. Neck supple. No thyromegaly present.  Cardiovascular: Normal rate, regular rhythm, normal heart sounds and intact distal pulses.   No murmur heard. Pulmonary/Chest: Effort normal and breath sounds normal. No respiratory distress. She has no wheezes.  Abdominal: Soft. Bowel sounds are normal. She exhibits no distension. There is no tenderness.  Musculoskeletal: Normal range of motion. She exhibits edema (trace in bilateral LE). She exhibits no tenderness.  Neurological: She is alert. She has normal reflexes. She is disoriented. No cranial nerve deficit.  Skin: Skin is warm and dry.  Psychiatric: Her behavior is normal. Her affect is inappropriate. Cognition and memory are impaired. She is noncommunicative. She exhibits abnormal recent memory and abnormal remote memory.  Unable to have conversation with pt r/t alzheimers  Vitals reviewed.     BP 108/67   Pulse 69   Temp (!) 96.3 F (35.7 C) (Axillary)      Assessment & Plan:  1. Essential hypertension - CMP14+EGFR  2. Thyroid disease - CMP14+EGFR - Thyroid Panel With TSH  3. Alzheimer's dementia without behavioral disturbance, unspecified timing of dementia onset - CMP14+EGFR  4. Agitation - CMP14+EGFR  5. Vitamin D deficiency - CMP14+EGFR   Continue all meds Labs pending Health Maintenance reviewed Diet and exercise encouraged RTO 1 year  Evelina Dun, FNP

## 2016-05-15 NOTE — Patient Instructions (Signed)
Follow up in 1 year.

## 2016-05-16 LAB — THYROID PANEL WITH TSH
Free Thyroxine Index: 1.5 (ref 1.2–4.9)
T3 UPTAKE RATIO: 28 % (ref 24–39)
T4, Total: 5.2 ug/dL (ref 4.5–12.0)
TSH: 1.71 u[IU]/mL (ref 0.450–4.500)

## 2016-05-16 LAB — CMP14+EGFR
ALK PHOS: 65 IU/L (ref 39–117)
ALT: 9 IU/L (ref 0–32)
AST: 15 IU/L (ref 0–40)
Albumin/Globulin Ratio: 1.6 (ref 1.2–2.2)
Albumin: 4 g/dL (ref 3.5–4.7)
BUN/Creatinine Ratio: 20 (ref 12–28)
BUN: 18 mg/dL (ref 8–27)
Bilirubin Total: 0.2 mg/dL (ref 0.0–1.2)
CO2: 29 mmol/L (ref 18–29)
CREATININE: 0.92 mg/dL (ref 0.57–1.00)
Calcium: 9.2 mg/dL (ref 8.7–10.3)
Chloride: 101 mmol/L (ref 96–106)
GFR calc Af Amer: 65 mL/min/{1.73_m2} (ref >59)
GFR calc non Af Amer: 56 mL/min/{1.73_m2} — ABNORMAL LOW (ref >59)
GLOBULIN, TOTAL: 2.5 g/dL (ref 1.5–4.5)
GLUCOSE: 119 mg/dL — AB (ref 65–99)
POTASSIUM: 4.4 mmol/L (ref 3.5–5.2)
SODIUM: 144 mmol/L (ref 134–144)
Total Protein: 6.5 g/dL (ref 6.0–8.5)

## 2016-07-31 ENCOUNTER — Telehealth: Payer: Self-pay | Admitting: Family

## 2016-07-31 NOTE — Telephone Encounter (Signed)
[facility] called and says that earlier pts fever was 99.0 and now it is 100.4 - runny nose and cough.  Dr Wendi Snipes want the pt seen tomorrow by someone.

## 2016-08-01 ENCOUNTER — Encounter: Payer: Self-pay | Admitting: Family

## 2016-08-01 ENCOUNTER — Ambulatory Visit (INDEPENDENT_AMBULATORY_CARE_PROVIDER_SITE_OTHER): Payer: Medicare Other | Admitting: Family

## 2016-08-01 VITALS — BP 130/84 | HR 88 | Temp 97.5°F | Ht 66.0 in

## 2016-08-01 DIAGNOSIS — J209 Acute bronchitis, unspecified: Secondary | ICD-10-CM

## 2016-08-01 MED ORDER — AZITHROMYCIN 250 MG PO TABS: ORAL_TABLET | ORAL | 0 refills | Status: AC

## 2016-08-01 NOTE — Patient Instructions (Signed)

## 2016-08-01 NOTE — Progress Notes (Signed)
   Subjective:    Patient ID: Teresa Douglas, female    DOB: 1926/01/08, 82 y.o.   MRN: [id number]  Pt presents to the office to the office today with fever and cough. PT has alzheimers and is unalbe to communicate her symptoms. Pt is a resident at [facility] and had a fever yesterday of 100.4 F.  Fever   This is a new problem. The current episode started yesterday. Associated symptoms include coughing. Pertinent negatives include no ear pain.  Cough  This is a new problem. The current episode started yesterday. The problem has been waxing and waning. The problem occurs every few minutes. The cough is non-productive. Associated symptoms include a fever, nasal congestion and rhinorrhea. Pertinent negatives include no ear congestion or ear pain. The symptoms are aggravated by lying down. She has tried rest for the symptoms. The treatment provided mild relief.      Review of Systems  Constitutional: Positive for fever.  HENT: Positive for rhinorrhea. Negative for ear pain.   Respiratory: Positive for cough.   All other systems reviewed and are negative.      Objective:   Physical Exam  Constitutional: She is oriented to person, place, and time. She appears well-developed and well-nourished.  HENT:  Head: Normocephalic and atraumatic.  Right Ear: External ear normal.  Left Ear: External ear normal.  Nasal passage erythemas with mild swelling   Eyes: Pupils are equal, round, and reactive to light.  Cardiovascular: Normal rate, regular rhythm, normal heart sounds and intact distal pulses.   No murmur heard. Pulmonary/Chest: Effort normal. No respiratory distress. She has no wheezes.  Coarse nonproductive cough   Musculoskeletal: Normal range of motion. She exhibits no edema or tenderness.  Neurological: She is oriented to person, place, and time.  Skin: Skin is warm and dry.  Psychiatric: She has a normal mood and affect. Thought content normal. Her speech is slurred. She is  agitated, aggressive and hyperactive. Cognition and memory are impaired. She expresses inappropriate judgment. She is noncommunicative.  Vitals reviewed.  Disoriented    BP 130/84   Pulse 88   Temp 97.5 F (36.4 C) (Axillary)   Ht 5\' 6"  (1.676 m)      Assessment & Plan:  1. Acute bronchitis, unspecified organism -- Take meds as prescribed - Use a cool mist humidifier  -Use saline nose sprays frequently -Saline irrigations of the nose can be very helpful if done frequently.  * 4X daily for 1 week*  * Use of a nettie pot can be helpful with this. Follow directions with this* -Force fluids -For any cough or congestion  Use plain Mucinex- regular strength or max strength is fine   * Children- consult with Pharmacist for dosing -For fever or aces or pains- take tylenol or ibuprofen appropriate for age and weight.  * for fevers greater than 101 orally you may alternate ibuprofen and tylenol every  3 hours. -Throat lozenges if help -New toothbrush in 3 days - azithromycin (ZITHROMAX) 250 MG tablet; Take 500 mg once, then 250 mg for four days  Dispense: 6 tablet; Refill: 0  Evelina Dun, FNP

## 2016-08-05 DIAGNOSIS — Z23 Encounter for immunization: Secondary | ICD-10-CM | POA: Diagnosis not present

## 2016-08-18 ENCOUNTER — Telehealth: Payer: Self-pay | Admitting: *Deleted

## 2016-08-18 NOTE — Telephone Encounter (Signed)
If pt's symptoms have resolved she should be okay.

## 2016-08-18 NOTE — Telephone Encounter (Signed)
[facility] calls to say patient has taken all her medication of the Z-pack that pharmacy sent.  It was short by the last two doses. Is this ok or will another script be done ?

## 2016-08-18 NOTE — Telephone Encounter (Signed)
Spoke with Jinny Blossom at [facility] who reports patient is doing much better, told her that [facility] said as long as she was better there was no need for more medicatIon.

## 2016-11-13 DIAGNOSIS — B351 Tinea unguium: Secondary | ICD-10-CM | POA: Diagnosis not present

## 2016-11-13 DIAGNOSIS — I70203 Unspecified atherosclerosis of native arteries of extremities, bilateral legs: Secondary | ICD-10-CM | POA: Diagnosis not present

## 2016-12-16 DIAGNOSIS — I6789 Other cerebrovascular disease: Secondary | ICD-10-CM | POA: Diagnosis not present

## 2016-12-16 DIAGNOSIS — R51 Headache: Secondary | ICD-10-CM | POA: Diagnosis not present

## 2016-12-16 DIAGNOSIS — S40012A Contusion of left shoulder, initial encounter: Secondary | ICD-10-CM | POA: Diagnosis not present

## 2016-12-16 DIAGNOSIS — Z79899 Other long term (current) drug therapy: Secondary | ICD-10-CM | POA: Diagnosis not present

## 2016-12-16 DIAGNOSIS — F4489 Other dissociative and conversion disorders: Secondary | ICD-10-CM | POA: Diagnosis not present

## 2016-12-16 DIAGNOSIS — S0990XA Unspecified injury of head, initial encounter: Secondary | ICD-10-CM | POA: Diagnosis not present

## 2016-12-16 DIAGNOSIS — G309 Alzheimer's disease, unspecified: Secondary | ICD-10-CM | POA: Diagnosis not present

## 2016-12-16 DIAGNOSIS — S0003XA Contusion of scalp, initial encounter: Secondary | ICD-10-CM | POA: Diagnosis not present

## 2016-12-16 DIAGNOSIS — W07XXXA Fall from chair, initial encounter: Secondary | ICD-10-CM | POA: Diagnosis not present

## 2016-12-16 DIAGNOSIS — S40212A Abrasion of left shoulder, initial encounter: Secondary | ICD-10-CM | POA: Diagnosis not present

## 2016-12-16 DIAGNOSIS — F0280 Dementia in other diseases classified elsewhere without behavioral disturbance: Secondary | ICD-10-CM | POA: Diagnosis not present

## 2017-01-14 DIAGNOSIS — M6281 Muscle weakness (generalized): Secondary | ICD-10-CM | POA: Diagnosis not present

## 2017-01-14 DIAGNOSIS — F411 Generalized anxiety disorder: Secondary | ICD-10-CM | POA: Diagnosis not present

## 2017-01-14 DIAGNOSIS — R3981 Functional urinary incontinence: Secondary | ICD-10-CM | POA: Diagnosis not present

## 2017-01-14 DIAGNOSIS — R279 Unspecified lack of coordination: Secondary | ICD-10-CM | POA: Diagnosis not present

## 2017-01-15 DIAGNOSIS — R3981 Functional urinary incontinence: Secondary | ICD-10-CM | POA: Diagnosis not present

## 2017-01-15 DIAGNOSIS — F411 Generalized anxiety disorder: Secondary | ICD-10-CM | POA: Diagnosis not present

## 2017-01-15 DIAGNOSIS — R279 Unspecified lack of coordination: Secondary | ICD-10-CM | POA: Diagnosis not present

## 2017-01-15 DIAGNOSIS — M6281 Muscle weakness (generalized): Secondary | ICD-10-CM | POA: Diagnosis not present

## 2017-01-16 DIAGNOSIS — M6281 Muscle weakness (generalized): Secondary | ICD-10-CM | POA: Diagnosis not present

## 2017-01-16 DIAGNOSIS — F411 Generalized anxiety disorder: Secondary | ICD-10-CM | POA: Diagnosis not present

## 2017-01-16 DIAGNOSIS — R279 Unspecified lack of coordination: Secondary | ICD-10-CM | POA: Diagnosis not present

## 2017-01-16 DIAGNOSIS — R3981 Functional urinary incontinence: Secondary | ICD-10-CM | POA: Diagnosis not present

## 2017-01-17 DIAGNOSIS — R279 Unspecified lack of coordination: Secondary | ICD-10-CM | POA: Diagnosis not present

## 2017-01-17 DIAGNOSIS — F411 Generalized anxiety disorder: Secondary | ICD-10-CM | POA: Diagnosis not present

## 2017-01-17 DIAGNOSIS — M6281 Muscle weakness (generalized): Secondary | ICD-10-CM | POA: Diagnosis not present

## 2017-01-17 DIAGNOSIS — R3981 Functional urinary incontinence: Secondary | ICD-10-CM | POA: Diagnosis not present

## 2017-01-19 DIAGNOSIS — F411 Generalized anxiety disorder: Secondary | ICD-10-CM | POA: Diagnosis not present

## 2017-01-19 DIAGNOSIS — R3981 Functional urinary incontinence: Secondary | ICD-10-CM | POA: Diagnosis not present

## 2017-01-19 DIAGNOSIS — M6281 Muscle weakness (generalized): Secondary | ICD-10-CM | POA: Diagnosis not present

## 2017-01-19 DIAGNOSIS — R279 Unspecified lack of coordination: Secondary | ICD-10-CM | POA: Diagnosis not present

## 2017-01-21 DIAGNOSIS — R3981 Functional urinary incontinence: Secondary | ICD-10-CM | POA: Diagnosis not present

## 2017-01-21 DIAGNOSIS — R279 Unspecified lack of coordination: Secondary | ICD-10-CM | POA: Diagnosis not present

## 2017-01-21 DIAGNOSIS — M6281 Muscle weakness (generalized): Secondary | ICD-10-CM | POA: Diagnosis not present

## 2017-01-21 DIAGNOSIS — F411 Generalized anxiety disorder: Secondary | ICD-10-CM | POA: Diagnosis not present

## 2017-01-22 DIAGNOSIS — M6281 Muscle weakness (generalized): Secondary | ICD-10-CM | POA: Diagnosis not present

## 2017-01-22 DIAGNOSIS — F411 Generalized anxiety disorder: Secondary | ICD-10-CM | POA: Diagnosis not present

## 2017-01-22 DIAGNOSIS — R279 Unspecified lack of coordination: Secondary | ICD-10-CM | POA: Diagnosis not present

## 2017-01-22 DIAGNOSIS — R3981 Functional urinary incontinence: Secondary | ICD-10-CM | POA: Diagnosis not present

## 2017-01-24 DIAGNOSIS — R3981 Functional urinary incontinence: Secondary | ICD-10-CM | POA: Diagnosis not present

## 2017-01-24 DIAGNOSIS — R279 Unspecified lack of coordination: Secondary | ICD-10-CM | POA: Diagnosis not present

## 2017-01-24 DIAGNOSIS — M6281 Muscle weakness (generalized): Secondary | ICD-10-CM | POA: Diagnosis not present

## 2017-01-24 DIAGNOSIS — F411 Generalized anxiety disorder: Secondary | ICD-10-CM | POA: Diagnosis not present

## 2017-01-26 DIAGNOSIS — F411 Generalized anxiety disorder: Secondary | ICD-10-CM | POA: Diagnosis not present

## 2017-01-26 DIAGNOSIS — M6281 Muscle weakness (generalized): Secondary | ICD-10-CM | POA: Diagnosis not present

## 2017-01-26 DIAGNOSIS — R3981 Functional urinary incontinence: Secondary | ICD-10-CM | POA: Diagnosis not present

## 2017-01-26 DIAGNOSIS — R279 Unspecified lack of coordination: Secondary | ICD-10-CM | POA: Diagnosis not present

## 2017-01-27 DIAGNOSIS — M6281 Muscle weakness (generalized): Secondary | ICD-10-CM | POA: Diagnosis not present

## 2017-01-27 DIAGNOSIS — R279 Unspecified lack of coordination: Secondary | ICD-10-CM | POA: Diagnosis not present

## 2017-01-27 DIAGNOSIS — F411 Generalized anxiety disorder: Secondary | ICD-10-CM | POA: Diagnosis not present

## 2017-01-27 DIAGNOSIS — R3981 Functional urinary incontinence: Secondary | ICD-10-CM | POA: Diagnosis not present

## 2017-01-29 DIAGNOSIS — F411 Generalized anxiety disorder: Secondary | ICD-10-CM | POA: Diagnosis not present

## 2017-01-29 DIAGNOSIS — R279 Unspecified lack of coordination: Secondary | ICD-10-CM | POA: Diagnosis not present

## 2017-01-29 DIAGNOSIS — R3981 Functional urinary incontinence: Secondary | ICD-10-CM | POA: Diagnosis not present

## 2017-01-29 DIAGNOSIS — M6281 Muscle weakness (generalized): Secondary | ICD-10-CM | POA: Diagnosis not present

## 2017-01-30 DIAGNOSIS — M6281 Muscle weakness (generalized): Secondary | ICD-10-CM | POA: Diagnosis not present

## 2017-01-30 DIAGNOSIS — F411 Generalized anxiety disorder: Secondary | ICD-10-CM | POA: Diagnosis not present

## 2017-01-30 DIAGNOSIS — R279 Unspecified lack of coordination: Secondary | ICD-10-CM | POA: Diagnosis not present

## 2017-01-30 DIAGNOSIS — R3981 Functional urinary incontinence: Secondary | ICD-10-CM | POA: Diagnosis not present

## 2017-02-03 DIAGNOSIS — R279 Unspecified lack of coordination: Secondary | ICD-10-CM | POA: Diagnosis not present

## 2017-02-03 DIAGNOSIS — R3981 Functional urinary incontinence: Secondary | ICD-10-CM | POA: Diagnosis not present

## 2017-02-03 DIAGNOSIS — F411 Generalized anxiety disorder: Secondary | ICD-10-CM | POA: Diagnosis not present

## 2017-02-03 DIAGNOSIS — M6281 Muscle weakness (generalized): Secondary | ICD-10-CM | POA: Diagnosis not present

## 2017-02-04 DIAGNOSIS — M6281 Muscle weakness (generalized): Secondary | ICD-10-CM | POA: Diagnosis not present

## 2017-02-04 DIAGNOSIS — R279 Unspecified lack of coordination: Secondary | ICD-10-CM | POA: Diagnosis not present

## 2017-02-04 DIAGNOSIS — R3981 Functional urinary incontinence: Secondary | ICD-10-CM | POA: Diagnosis not present

## 2017-02-04 DIAGNOSIS — F411 Generalized anxiety disorder: Secondary | ICD-10-CM | POA: Diagnosis not present

## 2017-02-05 DIAGNOSIS — F411 Generalized anxiety disorder: Secondary | ICD-10-CM | POA: Diagnosis not present

## 2017-02-05 DIAGNOSIS — R279 Unspecified lack of coordination: Secondary | ICD-10-CM | POA: Diagnosis not present

## 2017-02-05 DIAGNOSIS — R3981 Functional urinary incontinence: Secondary | ICD-10-CM | POA: Diagnosis not present

## 2017-02-05 DIAGNOSIS — M6281 Muscle weakness (generalized): Secondary | ICD-10-CM | POA: Diagnosis not present

## 2017-02-06 DIAGNOSIS — F411 Generalized anxiety disorder: Secondary | ICD-10-CM | POA: Diagnosis not present

## 2017-02-06 DIAGNOSIS — R3981 Functional urinary incontinence: Secondary | ICD-10-CM | POA: Diagnosis not present

## 2017-02-06 DIAGNOSIS — R279 Unspecified lack of coordination: Secondary | ICD-10-CM | POA: Diagnosis not present

## 2017-02-06 DIAGNOSIS — M6281 Muscle weakness (generalized): Secondary | ICD-10-CM | POA: Diagnosis not present

## 2017-02-11 DIAGNOSIS — R3981 Functional urinary incontinence: Secondary | ICD-10-CM | POA: Diagnosis not present

## 2017-02-11 DIAGNOSIS — M6281 Muscle weakness (generalized): Secondary | ICD-10-CM | POA: Diagnosis not present

## 2017-02-11 DIAGNOSIS — F411 Generalized anxiety disorder: Secondary | ICD-10-CM | POA: Diagnosis not present

## 2017-02-11 DIAGNOSIS — R279 Unspecified lack of coordination: Secondary | ICD-10-CM | POA: Diagnosis not present

## 2017-02-14 DIAGNOSIS — R3981 Functional urinary incontinence: Secondary | ICD-10-CM | POA: Diagnosis not present

## 2017-02-14 DIAGNOSIS — F411 Generalized anxiety disorder: Secondary | ICD-10-CM | POA: Diagnosis not present

## 2017-02-14 DIAGNOSIS — M6281 Muscle weakness (generalized): Secondary | ICD-10-CM | POA: Diagnosis not present

## 2017-02-14 DIAGNOSIS — R279 Unspecified lack of coordination: Secondary | ICD-10-CM | POA: Diagnosis not present

## 2017-02-24 DIAGNOSIS — F419 Anxiety disorder, unspecified: Secondary | ICD-10-CM | POA: Diagnosis not present

## 2017-02-24 DIAGNOSIS — R159 Full incontinence of feces: Secondary | ICD-10-CM | POA: Diagnosis not present

## 2017-02-24 DIAGNOSIS — E039 Hypothyroidism, unspecified: Secondary | ICD-10-CM | POA: Diagnosis not present

## 2017-02-24 DIAGNOSIS — G309 Alzheimer's disease, unspecified: Secondary | ICD-10-CM | POA: Diagnosis not present

## 2017-02-24 DIAGNOSIS — I10 Essential (primary) hypertension: Secondary | ICD-10-CM | POA: Diagnosis not present

## 2017-02-24 DIAGNOSIS — R32 Unspecified urinary incontinence: Secondary | ICD-10-CM | POA: Diagnosis not present

## 2017-02-24 DIAGNOSIS — Z515 Encounter for palliative care: Secondary | ICD-10-CM | POA: Diagnosis not present

## 2017-02-25 DIAGNOSIS — I10 Essential (primary) hypertension: Secondary | ICD-10-CM | POA: Diagnosis not present

## 2017-02-25 DIAGNOSIS — R279 Unspecified lack of coordination: Secondary | ICD-10-CM | POA: Diagnosis not present

## 2017-02-25 DIAGNOSIS — R3981 Functional urinary incontinence: Secondary | ICD-10-CM | POA: Diagnosis not present

## 2017-02-25 DIAGNOSIS — R159 Full incontinence of feces: Secondary | ICD-10-CM | POA: Diagnosis not present

## 2017-02-25 DIAGNOSIS — G309 Alzheimer's disease, unspecified: Secondary | ICD-10-CM | POA: Diagnosis not present

## 2017-02-25 DIAGNOSIS — R32 Unspecified urinary incontinence: Secondary | ICD-10-CM | POA: Diagnosis not present

## 2017-02-25 DIAGNOSIS — E039 Hypothyroidism, unspecified: Secondary | ICD-10-CM | POA: Diagnosis not present

## 2017-02-25 DIAGNOSIS — F411 Generalized anxiety disorder: Secondary | ICD-10-CM | POA: Diagnosis not present

## 2017-02-25 DIAGNOSIS — M6281 Muscle weakness (generalized): Secondary | ICD-10-CM | POA: Diagnosis not present

## 2017-02-25 DIAGNOSIS — F419 Anxiety disorder, unspecified: Secondary | ICD-10-CM | POA: Diagnosis not present

## 2017-02-27 DIAGNOSIS — R3981 Functional urinary incontinence: Secondary | ICD-10-CM | POA: Diagnosis not present

## 2017-02-27 DIAGNOSIS — R279 Unspecified lack of coordination: Secondary | ICD-10-CM | POA: Diagnosis not present

## 2017-02-27 DIAGNOSIS — F411 Generalized anxiety disorder: Secondary | ICD-10-CM | POA: Diagnosis not present

## 2017-02-27 DIAGNOSIS — M6281 Muscle weakness (generalized): Secondary | ICD-10-CM | POA: Diagnosis not present

## 2017-03-03 DIAGNOSIS — R279 Unspecified lack of coordination: Secondary | ICD-10-CM | POA: Diagnosis not present

## 2017-03-03 DIAGNOSIS — M6281 Muscle weakness (generalized): Secondary | ICD-10-CM | POA: Diagnosis not present

## 2017-03-03 DIAGNOSIS — R3981 Functional urinary incontinence: Secondary | ICD-10-CM | POA: Diagnosis not present

## 2017-03-03 DIAGNOSIS — I10 Essential (primary) hypertension: Secondary | ICD-10-CM | POA: Diagnosis not present

## 2017-03-03 DIAGNOSIS — R32 Unspecified urinary incontinence: Secondary | ICD-10-CM | POA: Diagnosis not present

## 2017-03-03 DIAGNOSIS — R159 Full incontinence of feces: Secondary | ICD-10-CM | POA: Diagnosis not present

## 2017-03-03 DIAGNOSIS — E039 Hypothyroidism, unspecified: Secondary | ICD-10-CM | POA: Diagnosis not present

## 2017-03-03 DIAGNOSIS — G309 Alzheimer's disease, unspecified: Secondary | ICD-10-CM | POA: Diagnosis not present

## 2017-03-03 DIAGNOSIS — F411 Generalized anxiety disorder: Secondary | ICD-10-CM | POA: Diagnosis not present

## 2017-03-03 DIAGNOSIS — F419 Anxiety disorder, unspecified: Secondary | ICD-10-CM | POA: Diagnosis not present

## 2017-03-07 DIAGNOSIS — F411 Generalized anxiety disorder: Secondary | ICD-10-CM | POA: Diagnosis not present

## 2017-03-07 DIAGNOSIS — M6281 Muscle weakness (generalized): Secondary | ICD-10-CM | POA: Diagnosis not present

## 2017-03-07 DIAGNOSIS — R3981 Functional urinary incontinence: Secondary | ICD-10-CM | POA: Diagnosis not present

## 2017-03-07 DIAGNOSIS — R279 Unspecified lack of coordination: Secondary | ICD-10-CM | POA: Diagnosis not present

## 2017-03-09 DIAGNOSIS — R279 Unspecified lack of coordination: Secondary | ICD-10-CM | POA: Diagnosis not present

## 2017-03-09 DIAGNOSIS — M6281 Muscle weakness (generalized): Secondary | ICD-10-CM | POA: Diagnosis not present

## 2017-03-09 DIAGNOSIS — F411 Generalized anxiety disorder: Secondary | ICD-10-CM | POA: Diagnosis not present

## 2017-03-09 DIAGNOSIS — R3981 Functional urinary incontinence: Secondary | ICD-10-CM | POA: Diagnosis not present

## 2017-03-10 DIAGNOSIS — R3981 Functional urinary incontinence: Secondary | ICD-10-CM | POA: Diagnosis not present

## 2017-03-10 DIAGNOSIS — M6281 Muscle weakness (generalized): Secondary | ICD-10-CM | POA: Diagnosis not present

## 2017-03-10 DIAGNOSIS — F411 Generalized anxiety disorder: Secondary | ICD-10-CM | POA: Diagnosis not present

## 2017-03-10 DIAGNOSIS — R279 Unspecified lack of coordination: Secondary | ICD-10-CM | POA: Diagnosis not present

## 2017-03-11 DIAGNOSIS — R32 Unspecified urinary incontinence: Secondary | ICD-10-CM | POA: Diagnosis not present

## 2017-03-11 DIAGNOSIS — E039 Hypothyroidism, unspecified: Secondary | ICD-10-CM | POA: Diagnosis not present

## 2017-03-11 DIAGNOSIS — R159 Full incontinence of feces: Secondary | ICD-10-CM | POA: Diagnosis not present

## 2017-03-11 DIAGNOSIS — I10 Essential (primary) hypertension: Secondary | ICD-10-CM | POA: Diagnosis not present

## 2017-03-11 DIAGNOSIS — G309 Alzheimer's disease, unspecified: Secondary | ICD-10-CM | POA: Diagnosis not present

## 2017-03-11 DIAGNOSIS — F419 Anxiety disorder, unspecified: Secondary | ICD-10-CM | POA: Diagnosis not present

## 2017-03-13 DIAGNOSIS — Z515 Encounter for palliative care: Secondary | ICD-10-CM | POA: Diagnosis not present

## 2017-03-13 DIAGNOSIS — G309 Alzheimer's disease, unspecified: Secondary | ICD-10-CM | POA: Diagnosis not present

## 2017-03-13 DIAGNOSIS — E039 Hypothyroidism, unspecified: Secondary | ICD-10-CM | POA: Diagnosis not present

## 2017-03-13 DIAGNOSIS — I10 Essential (primary) hypertension: Secondary | ICD-10-CM | POA: Diagnosis not present

## 2017-03-13 DIAGNOSIS — F419 Anxiety disorder, unspecified: Secondary | ICD-10-CM | POA: Diagnosis not present

## 2017-03-13 DIAGNOSIS — R32 Unspecified urinary incontinence: Secondary | ICD-10-CM | POA: Diagnosis not present

## 2017-03-13 DIAGNOSIS — R159 Full incontinence of feces: Secondary | ICD-10-CM | POA: Diagnosis not present

## 2017-03-16 DIAGNOSIS — I10 Essential (primary) hypertension: Secondary | ICD-10-CM | POA: Diagnosis not present

## 2017-03-16 DIAGNOSIS — G309 Alzheimer's disease, unspecified: Secondary | ICD-10-CM | POA: Diagnosis not present

## 2017-03-16 DIAGNOSIS — R32 Unspecified urinary incontinence: Secondary | ICD-10-CM | POA: Diagnosis not present

## 2017-03-16 DIAGNOSIS — E039 Hypothyroidism, unspecified: Secondary | ICD-10-CM | POA: Diagnosis not present

## 2017-03-16 DIAGNOSIS — R159 Full incontinence of feces: Secondary | ICD-10-CM | POA: Diagnosis not present

## 2017-03-16 DIAGNOSIS — F419 Anxiety disorder, unspecified: Secondary | ICD-10-CM | POA: Diagnosis not present

## 2017-03-17 DIAGNOSIS — I10 Essential (primary) hypertension: Secondary | ICD-10-CM | POA: Diagnosis not present

## 2017-03-17 DIAGNOSIS — R159 Full incontinence of feces: Secondary | ICD-10-CM | POA: Diagnosis not present

## 2017-03-17 DIAGNOSIS — R32 Unspecified urinary incontinence: Secondary | ICD-10-CM | POA: Diagnosis not present

## 2017-03-17 DIAGNOSIS — F419 Anxiety disorder, unspecified: Secondary | ICD-10-CM | POA: Diagnosis not present

## 2017-03-17 DIAGNOSIS — G309 Alzheimer's disease, unspecified: Secondary | ICD-10-CM | POA: Diagnosis not present

## 2017-03-17 DIAGNOSIS — E039 Hypothyroidism, unspecified: Secondary | ICD-10-CM | POA: Diagnosis not present

## 2017-03-23 DIAGNOSIS — I10 Essential (primary) hypertension: Secondary | ICD-10-CM | POA: Diagnosis not present

## 2017-03-23 DIAGNOSIS — R32 Unspecified urinary incontinence: Secondary | ICD-10-CM | POA: Diagnosis not present

## 2017-03-23 DIAGNOSIS — E039 Hypothyroidism, unspecified: Secondary | ICD-10-CM | POA: Diagnosis not present

## 2017-03-23 DIAGNOSIS — G309 Alzheimer's disease, unspecified: Secondary | ICD-10-CM | POA: Diagnosis not present

## 2017-03-23 DIAGNOSIS — R159 Full incontinence of feces: Secondary | ICD-10-CM | POA: Diagnosis not present

## 2017-03-23 DIAGNOSIS — F419 Anxiety disorder, unspecified: Secondary | ICD-10-CM | POA: Diagnosis not present

## 2017-03-24 DIAGNOSIS — I10 Essential (primary) hypertension: Secondary | ICD-10-CM | POA: Diagnosis not present

## 2017-03-24 DIAGNOSIS — R32 Unspecified urinary incontinence: Secondary | ICD-10-CM | POA: Diagnosis not present

## 2017-03-24 DIAGNOSIS — G309 Alzheimer's disease, unspecified: Secondary | ICD-10-CM | POA: Diagnosis not present

## 2017-03-24 DIAGNOSIS — F419 Anxiety disorder, unspecified: Secondary | ICD-10-CM | POA: Diagnosis not present

## 2017-03-24 DIAGNOSIS — E039 Hypothyroidism, unspecified: Secondary | ICD-10-CM | POA: Diagnosis not present

## 2017-03-24 DIAGNOSIS — R159 Full incontinence of feces: Secondary | ICD-10-CM | POA: Diagnosis not present

## 2017-03-25 DIAGNOSIS — E039 Hypothyroidism, unspecified: Secondary | ICD-10-CM | POA: Diagnosis not present

## 2017-03-25 DIAGNOSIS — R32 Unspecified urinary incontinence: Secondary | ICD-10-CM | POA: Diagnosis not present

## 2017-03-25 DIAGNOSIS — I10 Essential (primary) hypertension: Secondary | ICD-10-CM | POA: Diagnosis not present

## 2017-03-25 DIAGNOSIS — R159 Full incontinence of feces: Secondary | ICD-10-CM | POA: Diagnosis not present

## 2017-03-25 DIAGNOSIS — G309 Alzheimer's disease, unspecified: Secondary | ICD-10-CM | POA: Diagnosis not present

## 2017-03-25 DIAGNOSIS — F419 Anxiety disorder, unspecified: Secondary | ICD-10-CM | POA: Diagnosis not present

## 2017-03-27 DIAGNOSIS — E039 Hypothyroidism, unspecified: Secondary | ICD-10-CM | POA: Diagnosis not present

## 2017-03-27 DIAGNOSIS — R32 Unspecified urinary incontinence: Secondary | ICD-10-CM | POA: Diagnosis not present

## 2017-03-27 DIAGNOSIS — I10 Essential (primary) hypertension: Secondary | ICD-10-CM | POA: Diagnosis not present

## 2017-03-27 DIAGNOSIS — R159 Full incontinence of feces: Secondary | ICD-10-CM | POA: Diagnosis not present

## 2017-03-27 DIAGNOSIS — G309 Alzheimer's disease, unspecified: Secondary | ICD-10-CM | POA: Diagnosis not present

## 2017-03-27 DIAGNOSIS — F419 Anxiety disorder, unspecified: Secondary | ICD-10-CM | POA: Diagnosis not present

## 2017-03-30 DIAGNOSIS — E039 Hypothyroidism, unspecified: Secondary | ICD-10-CM | POA: Diagnosis not present

## 2017-03-30 DIAGNOSIS — F419 Anxiety disorder, unspecified: Secondary | ICD-10-CM | POA: Diagnosis not present

## 2017-03-30 DIAGNOSIS — R159 Full incontinence of feces: Secondary | ICD-10-CM | POA: Diagnosis not present

## 2017-03-30 DIAGNOSIS — G309 Alzheimer's disease, unspecified: Secondary | ICD-10-CM | POA: Diagnosis not present

## 2017-03-30 DIAGNOSIS — I10 Essential (primary) hypertension: Secondary | ICD-10-CM | POA: Diagnosis not present

## 2017-03-30 DIAGNOSIS — R32 Unspecified urinary incontinence: Secondary | ICD-10-CM | POA: Diagnosis not present

## 2017-04-01 DIAGNOSIS — R32 Unspecified urinary incontinence: Secondary | ICD-10-CM | POA: Diagnosis not present

## 2017-04-01 DIAGNOSIS — G309 Alzheimer's disease, unspecified: Secondary | ICD-10-CM | POA: Diagnosis not present

## 2017-04-01 DIAGNOSIS — R159 Full incontinence of feces: Secondary | ICD-10-CM | POA: Diagnosis not present

## 2017-04-01 DIAGNOSIS — F419 Anxiety disorder, unspecified: Secondary | ICD-10-CM | POA: Diagnosis not present

## 2017-04-01 DIAGNOSIS — E039 Hypothyroidism, unspecified: Secondary | ICD-10-CM | POA: Diagnosis not present

## 2017-04-01 DIAGNOSIS — I10 Essential (primary) hypertension: Secondary | ICD-10-CM | POA: Diagnosis not present

## 2017-04-03 DIAGNOSIS — I10 Essential (primary) hypertension: Secondary | ICD-10-CM | POA: Diagnosis not present

## 2017-04-03 DIAGNOSIS — E039 Hypothyroidism, unspecified: Secondary | ICD-10-CM | POA: Diagnosis not present

## 2017-04-03 DIAGNOSIS — R32 Unspecified urinary incontinence: Secondary | ICD-10-CM | POA: Diagnosis not present

## 2017-04-03 DIAGNOSIS — F419 Anxiety disorder, unspecified: Secondary | ICD-10-CM | POA: Diagnosis not present

## 2017-04-03 DIAGNOSIS — G309 Alzheimer's disease, unspecified: Secondary | ICD-10-CM | POA: Diagnosis not present

## 2017-04-03 DIAGNOSIS — R159 Full incontinence of feces: Secondary | ICD-10-CM | POA: Diagnosis not present

## 2017-04-06 DIAGNOSIS — E039 Hypothyroidism, unspecified: Secondary | ICD-10-CM | POA: Diagnosis not present

## 2017-04-06 DIAGNOSIS — R159 Full incontinence of feces: Secondary | ICD-10-CM | POA: Diagnosis not present

## 2017-04-06 DIAGNOSIS — F419 Anxiety disorder, unspecified: Secondary | ICD-10-CM | POA: Diagnosis not present

## 2017-04-06 DIAGNOSIS — I10 Essential (primary) hypertension: Secondary | ICD-10-CM | POA: Diagnosis not present

## 2017-04-06 DIAGNOSIS — G309 Alzheimer's disease, unspecified: Secondary | ICD-10-CM | POA: Diagnosis not present

## 2017-04-06 DIAGNOSIS — R32 Unspecified urinary incontinence: Secondary | ICD-10-CM | POA: Diagnosis not present

## 2017-04-07 DIAGNOSIS — R32 Unspecified urinary incontinence: Secondary | ICD-10-CM | POA: Diagnosis not present

## 2017-04-07 DIAGNOSIS — E039 Hypothyroidism, unspecified: Secondary | ICD-10-CM | POA: Diagnosis not present

## 2017-04-07 DIAGNOSIS — R159 Full incontinence of feces: Secondary | ICD-10-CM | POA: Diagnosis not present

## 2017-04-07 DIAGNOSIS — I10 Essential (primary) hypertension: Secondary | ICD-10-CM | POA: Diagnosis not present

## 2017-04-07 DIAGNOSIS — G309 Alzheimer's disease, unspecified: Secondary | ICD-10-CM | POA: Diagnosis not present

## 2017-04-07 DIAGNOSIS — F419 Anxiety disorder, unspecified: Secondary | ICD-10-CM | POA: Diagnosis not present

## 2017-04-08 DIAGNOSIS — I10 Essential (primary) hypertension: Secondary | ICD-10-CM | POA: Diagnosis not present

## 2017-04-08 DIAGNOSIS — E039 Hypothyroidism, unspecified: Secondary | ICD-10-CM | POA: Diagnosis not present

## 2017-04-08 DIAGNOSIS — G309 Alzheimer's disease, unspecified: Secondary | ICD-10-CM | POA: Diagnosis not present

## 2017-04-08 DIAGNOSIS — R32 Unspecified urinary incontinence: Secondary | ICD-10-CM | POA: Diagnosis not present

## 2017-04-08 DIAGNOSIS — F419 Anxiety disorder, unspecified: Secondary | ICD-10-CM | POA: Diagnosis not present

## 2017-04-08 DIAGNOSIS — R159 Full incontinence of feces: Secondary | ICD-10-CM | POA: Diagnosis not present

## 2017-04-10 DIAGNOSIS — I10 Essential (primary) hypertension: Secondary | ICD-10-CM | POA: Diagnosis not present

## 2017-04-10 DIAGNOSIS — R32 Unspecified urinary incontinence: Secondary | ICD-10-CM | POA: Diagnosis not present

## 2017-04-10 DIAGNOSIS — R159 Full incontinence of feces: Secondary | ICD-10-CM | POA: Diagnosis not present

## 2017-04-10 DIAGNOSIS — G309 Alzheimer's disease, unspecified: Secondary | ICD-10-CM | POA: Diagnosis not present

## 2017-04-10 DIAGNOSIS — F419 Anxiety disorder, unspecified: Secondary | ICD-10-CM | POA: Diagnosis not present

## 2017-04-10 DIAGNOSIS — E039 Hypothyroidism, unspecified: Secondary | ICD-10-CM | POA: Diagnosis not present

## 2017-04-12 DIAGNOSIS — R159 Full incontinence of feces: Secondary | ICD-10-CM | POA: Diagnosis not present

## 2017-04-12 DIAGNOSIS — I10 Essential (primary) hypertension: Secondary | ICD-10-CM | POA: Diagnosis not present

## 2017-04-12 DIAGNOSIS — G309 Alzheimer's disease, unspecified: Secondary | ICD-10-CM | POA: Diagnosis not present

## 2017-04-12 DIAGNOSIS — Z515 Encounter for palliative care: Secondary | ICD-10-CM | POA: Diagnosis not present

## 2017-04-12 DIAGNOSIS — F419 Anxiety disorder, unspecified: Secondary | ICD-10-CM | POA: Diagnosis not present

## 2017-04-12 DIAGNOSIS — R32 Unspecified urinary incontinence: Secondary | ICD-10-CM | POA: Diagnosis not present

## 2017-04-12 DIAGNOSIS — E039 Hypothyroidism, unspecified: Secondary | ICD-10-CM | POA: Diagnosis not present

## 2017-04-13 DIAGNOSIS — F419 Anxiety disorder, unspecified: Secondary | ICD-10-CM | POA: Diagnosis not present

## 2017-04-13 DIAGNOSIS — G309 Alzheimer's disease, unspecified: Secondary | ICD-10-CM | POA: Diagnosis not present

## 2017-04-13 DIAGNOSIS — I10 Essential (primary) hypertension: Secondary | ICD-10-CM | POA: Diagnosis not present

## 2017-04-13 DIAGNOSIS — E039 Hypothyroidism, unspecified: Secondary | ICD-10-CM | POA: Diagnosis not present

## 2017-04-13 DIAGNOSIS — R159 Full incontinence of feces: Secondary | ICD-10-CM | POA: Diagnosis not present

## 2017-04-13 DIAGNOSIS — R32 Unspecified urinary incontinence: Secondary | ICD-10-CM | POA: Diagnosis not present

## 2017-04-17 DIAGNOSIS — G309 Alzheimer's disease, unspecified: Secondary | ICD-10-CM | POA: Diagnosis not present

## 2017-04-17 DIAGNOSIS — R159 Full incontinence of feces: Secondary | ICD-10-CM | POA: Diagnosis not present

## 2017-04-17 DIAGNOSIS — F419 Anxiety disorder, unspecified: Secondary | ICD-10-CM | POA: Diagnosis not present

## 2017-04-17 DIAGNOSIS — R32 Unspecified urinary incontinence: Secondary | ICD-10-CM | POA: Diagnosis not present

## 2017-04-17 DIAGNOSIS — I10 Essential (primary) hypertension: Secondary | ICD-10-CM | POA: Diagnosis not present

## 2017-04-17 DIAGNOSIS — E039 Hypothyroidism, unspecified: Secondary | ICD-10-CM | POA: Diagnosis not present

## 2017-04-18 DIAGNOSIS — W01198A Fall on same level from slipping, tripping and stumbling with subsequent striking against other object, initial encounter: Secondary | ICD-10-CM | POA: Diagnosis not present

## 2017-04-18 DIAGNOSIS — R279 Unspecified lack of coordination: Secondary | ICD-10-CM | POA: Diagnosis not present

## 2017-04-18 DIAGNOSIS — M545 Low back pain: Secondary | ICD-10-CM | POA: Diagnosis not present

## 2017-04-18 DIAGNOSIS — S098XXA Other specified injuries of head, initial encounter: Secondary | ICD-10-CM | POA: Diagnosis not present

## 2017-04-18 DIAGNOSIS — R9431 Abnormal electrocardiogram [ECG] [EKG]: Secondary | ICD-10-CM | POA: Diagnosis not present

## 2017-04-18 DIAGNOSIS — S0190XA Unspecified open wound of unspecified part of head, initial encounter: Secondary | ICD-10-CM | POA: Diagnosis not present

## 2017-04-18 DIAGNOSIS — S199XXA Unspecified injury of neck, initial encounter: Secondary | ICD-10-CM | POA: Diagnosis not present

## 2017-04-18 DIAGNOSIS — M546 Pain in thoracic spine: Secondary | ICD-10-CM | POA: Diagnosis not present

## 2017-04-18 DIAGNOSIS — Z7401 Bed confinement status: Secondary | ICD-10-CM | POA: Diagnosis not present

## 2017-04-18 DIAGNOSIS — S3992XA Unspecified injury of lower back, initial encounter: Secondary | ICD-10-CM | POA: Diagnosis not present

## 2017-04-18 DIAGNOSIS — S0101XA Laceration without foreign body of scalp, initial encounter: Secondary | ICD-10-CM | POA: Diagnosis not present

## 2017-04-20 DIAGNOSIS — R32 Unspecified urinary incontinence: Secondary | ICD-10-CM | POA: Diagnosis not present

## 2017-04-20 DIAGNOSIS — G309 Alzheimer's disease, unspecified: Secondary | ICD-10-CM | POA: Diagnosis not present

## 2017-04-20 DIAGNOSIS — F419 Anxiety disorder, unspecified: Secondary | ICD-10-CM | POA: Diagnosis not present

## 2017-04-20 DIAGNOSIS — R159 Full incontinence of feces: Secondary | ICD-10-CM | POA: Diagnosis not present

## 2017-04-20 DIAGNOSIS — I10 Essential (primary) hypertension: Secondary | ICD-10-CM | POA: Diagnosis not present

## 2017-04-20 DIAGNOSIS — E039 Hypothyroidism, unspecified: Secondary | ICD-10-CM | POA: Diagnosis not present

## 2017-04-21 DIAGNOSIS — G309 Alzheimer's disease, unspecified: Secondary | ICD-10-CM | POA: Diagnosis not present

## 2017-04-21 DIAGNOSIS — R159 Full incontinence of feces: Secondary | ICD-10-CM | POA: Diagnosis not present

## 2017-04-21 DIAGNOSIS — I10 Essential (primary) hypertension: Secondary | ICD-10-CM | POA: Diagnosis not present

## 2017-04-21 DIAGNOSIS — E039 Hypothyroidism, unspecified: Secondary | ICD-10-CM | POA: Diagnosis not present

## 2017-04-21 DIAGNOSIS — R32 Unspecified urinary incontinence: Secondary | ICD-10-CM | POA: Diagnosis not present

## 2017-04-21 DIAGNOSIS — F419 Anxiety disorder, unspecified: Secondary | ICD-10-CM | POA: Diagnosis not present

## 2017-04-22 DIAGNOSIS — R159 Full incontinence of feces: Secondary | ICD-10-CM | POA: Diagnosis not present

## 2017-04-22 DIAGNOSIS — G309 Alzheimer's disease, unspecified: Secondary | ICD-10-CM | POA: Diagnosis not present

## 2017-04-22 DIAGNOSIS — E039 Hypothyroidism, unspecified: Secondary | ICD-10-CM | POA: Diagnosis not present

## 2017-04-22 DIAGNOSIS — F419 Anxiety disorder, unspecified: Secondary | ICD-10-CM | POA: Diagnosis not present

## 2017-04-22 DIAGNOSIS — R32 Unspecified urinary incontinence: Secondary | ICD-10-CM | POA: Diagnosis not present

## 2017-04-22 DIAGNOSIS — I10 Essential (primary) hypertension: Secondary | ICD-10-CM | POA: Diagnosis not present

## 2017-04-24 DIAGNOSIS — E039 Hypothyroidism, unspecified: Secondary | ICD-10-CM | POA: Diagnosis not present

## 2017-04-24 DIAGNOSIS — R32 Unspecified urinary incontinence: Secondary | ICD-10-CM | POA: Diagnosis not present

## 2017-04-24 DIAGNOSIS — G309 Alzheimer's disease, unspecified: Secondary | ICD-10-CM | POA: Diagnosis not present

## 2017-04-24 DIAGNOSIS — I10 Essential (primary) hypertension: Secondary | ICD-10-CM | POA: Diagnosis not present

## 2017-04-24 DIAGNOSIS — F419 Anxiety disorder, unspecified: Secondary | ICD-10-CM | POA: Diagnosis not present

## 2017-04-24 DIAGNOSIS — R159 Full incontinence of feces: Secondary | ICD-10-CM | POA: Diagnosis not present

## 2017-04-25 DIAGNOSIS — R159 Full incontinence of feces: Secondary | ICD-10-CM | POA: Diagnosis not present

## 2017-04-25 DIAGNOSIS — I10 Essential (primary) hypertension: Secondary | ICD-10-CM | POA: Diagnosis not present

## 2017-04-25 DIAGNOSIS — F419 Anxiety disorder, unspecified: Secondary | ICD-10-CM | POA: Diagnosis not present

## 2017-04-25 DIAGNOSIS — R32 Unspecified urinary incontinence: Secondary | ICD-10-CM | POA: Diagnosis not present

## 2017-04-25 DIAGNOSIS — G309 Alzheimer's disease, unspecified: Secondary | ICD-10-CM | POA: Diagnosis not present

## 2017-04-25 DIAGNOSIS — E039 Hypothyroidism, unspecified: Secondary | ICD-10-CM | POA: Diagnosis not present

## 2017-04-27 DIAGNOSIS — R32 Unspecified urinary incontinence: Secondary | ICD-10-CM | POA: Diagnosis not present

## 2017-04-27 DIAGNOSIS — F419 Anxiety disorder, unspecified: Secondary | ICD-10-CM | POA: Diagnosis not present

## 2017-04-27 DIAGNOSIS — I10 Essential (primary) hypertension: Secondary | ICD-10-CM | POA: Diagnosis not present

## 2017-04-27 DIAGNOSIS — R159 Full incontinence of feces: Secondary | ICD-10-CM | POA: Diagnosis not present

## 2017-04-27 DIAGNOSIS — E039 Hypothyroidism, unspecified: Secondary | ICD-10-CM | POA: Diagnosis not present

## 2017-04-27 DIAGNOSIS — G309 Alzheimer's disease, unspecified: Secondary | ICD-10-CM | POA: Diagnosis not present

## 2017-04-29 DIAGNOSIS — I10 Essential (primary) hypertension: Secondary | ICD-10-CM | POA: Diagnosis not present

## 2017-04-29 DIAGNOSIS — F419 Anxiety disorder, unspecified: Secondary | ICD-10-CM | POA: Diagnosis not present

## 2017-04-29 DIAGNOSIS — E039 Hypothyroidism, unspecified: Secondary | ICD-10-CM | POA: Diagnosis not present

## 2017-04-29 DIAGNOSIS — R159 Full incontinence of feces: Secondary | ICD-10-CM | POA: Diagnosis not present

## 2017-04-29 DIAGNOSIS — R32 Unspecified urinary incontinence: Secondary | ICD-10-CM | POA: Diagnosis not present

## 2017-04-29 DIAGNOSIS — G309 Alzheimer's disease, unspecified: Secondary | ICD-10-CM | POA: Diagnosis not present

## 2017-04-30 DIAGNOSIS — I10 Essential (primary) hypertension: Secondary | ICD-10-CM | POA: Diagnosis not present

## 2017-04-30 DIAGNOSIS — F419 Anxiety disorder, unspecified: Secondary | ICD-10-CM | POA: Diagnosis not present

## 2017-04-30 DIAGNOSIS — E039 Hypothyroidism, unspecified: Secondary | ICD-10-CM | POA: Diagnosis not present

## 2017-04-30 DIAGNOSIS — R159 Full incontinence of feces: Secondary | ICD-10-CM | POA: Diagnosis not present

## 2017-04-30 DIAGNOSIS — R32 Unspecified urinary incontinence: Secondary | ICD-10-CM | POA: Diagnosis not present

## 2017-04-30 DIAGNOSIS — G309 Alzheimer's disease, unspecified: Secondary | ICD-10-CM | POA: Diagnosis not present

## 2017-05-01 DIAGNOSIS — R32 Unspecified urinary incontinence: Secondary | ICD-10-CM | POA: Diagnosis not present

## 2017-05-01 DIAGNOSIS — I10 Essential (primary) hypertension: Secondary | ICD-10-CM | POA: Diagnosis not present

## 2017-05-01 DIAGNOSIS — E039 Hypothyroidism, unspecified: Secondary | ICD-10-CM | POA: Diagnosis not present

## 2017-05-01 DIAGNOSIS — R159 Full incontinence of feces: Secondary | ICD-10-CM | POA: Diagnosis not present

## 2017-05-01 DIAGNOSIS — G309 Alzheimer's disease, unspecified: Secondary | ICD-10-CM | POA: Diagnosis not present

## 2017-05-01 DIAGNOSIS — F419 Anxiety disorder, unspecified: Secondary | ICD-10-CM | POA: Diagnosis not present

## 2017-05-04 DIAGNOSIS — F419 Anxiety disorder, unspecified: Secondary | ICD-10-CM | POA: Diagnosis not present

## 2017-05-04 DIAGNOSIS — R32 Unspecified urinary incontinence: Secondary | ICD-10-CM | POA: Diagnosis not present

## 2017-05-04 DIAGNOSIS — R159 Full incontinence of feces: Secondary | ICD-10-CM | POA: Diagnosis not present

## 2017-05-04 DIAGNOSIS — G309 Alzheimer's disease, unspecified: Secondary | ICD-10-CM | POA: Diagnosis not present

## 2017-05-04 DIAGNOSIS — I10 Essential (primary) hypertension: Secondary | ICD-10-CM | POA: Diagnosis not present

## 2017-05-04 DIAGNOSIS — E039 Hypothyroidism, unspecified: Secondary | ICD-10-CM | POA: Diagnosis not present

## 2017-05-06 DIAGNOSIS — R32 Unspecified urinary incontinence: Secondary | ICD-10-CM | POA: Diagnosis not present

## 2017-05-06 DIAGNOSIS — E039 Hypothyroidism, unspecified: Secondary | ICD-10-CM | POA: Diagnosis not present

## 2017-05-06 DIAGNOSIS — F419 Anxiety disorder, unspecified: Secondary | ICD-10-CM | POA: Diagnosis not present

## 2017-05-06 DIAGNOSIS — I10 Essential (primary) hypertension: Secondary | ICD-10-CM | POA: Diagnosis not present

## 2017-05-06 DIAGNOSIS — R159 Full incontinence of feces: Secondary | ICD-10-CM | POA: Diagnosis not present

## 2017-05-06 DIAGNOSIS — G309 Alzheimer's disease, unspecified: Secondary | ICD-10-CM | POA: Diagnosis not present

## 2017-05-08 DIAGNOSIS — G309 Alzheimer's disease, unspecified: Secondary | ICD-10-CM | POA: Diagnosis not present

## 2017-05-08 DIAGNOSIS — R159 Full incontinence of feces: Secondary | ICD-10-CM | POA: Diagnosis not present

## 2017-05-08 DIAGNOSIS — F419 Anxiety disorder, unspecified: Secondary | ICD-10-CM | POA: Diagnosis not present

## 2017-05-08 DIAGNOSIS — E039 Hypothyroidism, unspecified: Secondary | ICD-10-CM | POA: Diagnosis not present

## 2017-05-08 DIAGNOSIS — I10 Essential (primary) hypertension: Secondary | ICD-10-CM | POA: Diagnosis not present

## 2017-05-08 DIAGNOSIS — R32 Unspecified urinary incontinence: Secondary | ICD-10-CM | POA: Diagnosis not present

## 2017-05-11 DIAGNOSIS — R159 Full incontinence of feces: Secondary | ICD-10-CM | POA: Diagnosis not present

## 2017-05-11 DIAGNOSIS — E039 Hypothyroidism, unspecified: Secondary | ICD-10-CM | POA: Diagnosis not present

## 2017-05-11 DIAGNOSIS — I10 Essential (primary) hypertension: Secondary | ICD-10-CM | POA: Diagnosis not present

## 2017-05-11 DIAGNOSIS — G309 Alzheimer's disease, unspecified: Secondary | ICD-10-CM | POA: Diagnosis not present

## 2017-05-11 DIAGNOSIS — F419 Anxiety disorder, unspecified: Secondary | ICD-10-CM | POA: Diagnosis not present

## 2017-05-11 DIAGNOSIS — R32 Unspecified urinary incontinence: Secondary | ICD-10-CM | POA: Diagnosis not present

## 2017-05-13 DIAGNOSIS — R32 Unspecified urinary incontinence: Secondary | ICD-10-CM | POA: Diagnosis not present

## 2017-05-13 DIAGNOSIS — G309 Alzheimer's disease, unspecified: Secondary | ICD-10-CM | POA: Diagnosis not present

## 2017-05-13 DIAGNOSIS — E039 Hypothyroidism, unspecified: Secondary | ICD-10-CM | POA: Diagnosis not present

## 2017-05-13 DIAGNOSIS — Z515 Encounter for palliative care: Secondary | ICD-10-CM | POA: Diagnosis not present

## 2017-05-13 DIAGNOSIS — I10 Essential (primary) hypertension: Secondary | ICD-10-CM | POA: Diagnosis not present

## 2017-05-13 DIAGNOSIS — F419 Anxiety disorder, unspecified: Secondary | ICD-10-CM | POA: Diagnosis not present

## 2017-05-13 DIAGNOSIS — R159 Full incontinence of feces: Secondary | ICD-10-CM | POA: Diagnosis not present

## 2017-05-14 DIAGNOSIS — I10 Essential (primary) hypertension: Secondary | ICD-10-CM | POA: Diagnosis not present

## 2017-05-14 DIAGNOSIS — G309 Alzheimer's disease, unspecified: Secondary | ICD-10-CM | POA: Diagnosis not present

## 2017-05-14 DIAGNOSIS — R159 Full incontinence of feces: Secondary | ICD-10-CM | POA: Diagnosis not present

## 2017-05-14 DIAGNOSIS — E039 Hypothyroidism, unspecified: Secondary | ICD-10-CM | POA: Diagnosis not present

## 2017-05-14 DIAGNOSIS — R32 Unspecified urinary incontinence: Secondary | ICD-10-CM | POA: Diagnosis not present

## 2017-05-14 DIAGNOSIS — F419 Anxiety disorder, unspecified: Secondary | ICD-10-CM | POA: Diagnosis not present

## 2017-05-15 DIAGNOSIS — R32 Unspecified urinary incontinence: Secondary | ICD-10-CM | POA: Diagnosis not present

## 2017-05-15 DIAGNOSIS — I10 Essential (primary) hypertension: Secondary | ICD-10-CM | POA: Diagnosis not present

## 2017-05-15 DIAGNOSIS — R159 Full incontinence of feces: Secondary | ICD-10-CM | POA: Diagnosis not present

## 2017-05-15 DIAGNOSIS — G309 Alzheimer's disease, unspecified: Secondary | ICD-10-CM | POA: Diagnosis not present

## 2017-05-15 DIAGNOSIS — F419 Anxiety disorder, unspecified: Secondary | ICD-10-CM | POA: Diagnosis not present

## 2017-05-15 DIAGNOSIS — E039 Hypothyroidism, unspecified: Secondary | ICD-10-CM | POA: Diagnosis not present

## 2017-05-18 DIAGNOSIS — I10 Essential (primary) hypertension: Secondary | ICD-10-CM | POA: Diagnosis not present

## 2017-05-18 DIAGNOSIS — R159 Full incontinence of feces: Secondary | ICD-10-CM | POA: Diagnosis not present

## 2017-05-18 DIAGNOSIS — G309 Alzheimer's disease, unspecified: Secondary | ICD-10-CM | POA: Diagnosis not present

## 2017-05-18 DIAGNOSIS — E039 Hypothyroidism, unspecified: Secondary | ICD-10-CM | POA: Diagnosis not present

## 2017-05-18 DIAGNOSIS — R32 Unspecified urinary incontinence: Secondary | ICD-10-CM | POA: Diagnosis not present

## 2017-05-18 DIAGNOSIS — F419 Anxiety disorder, unspecified: Secondary | ICD-10-CM | POA: Diagnosis not present

## 2017-05-20 DIAGNOSIS — G309 Alzheimer's disease, unspecified: Secondary | ICD-10-CM | POA: Diagnosis not present

## 2017-05-20 DIAGNOSIS — I10 Essential (primary) hypertension: Secondary | ICD-10-CM | POA: Diagnosis not present

## 2017-05-20 DIAGNOSIS — F419 Anxiety disorder, unspecified: Secondary | ICD-10-CM | POA: Diagnosis not present

## 2017-05-20 DIAGNOSIS — R159 Full incontinence of feces: Secondary | ICD-10-CM | POA: Diagnosis not present

## 2017-05-20 DIAGNOSIS — E039 Hypothyroidism, unspecified: Secondary | ICD-10-CM | POA: Diagnosis not present

## 2017-05-20 DIAGNOSIS — R32 Unspecified urinary incontinence: Secondary | ICD-10-CM | POA: Diagnosis not present

## 2017-05-22 DIAGNOSIS — R32 Unspecified urinary incontinence: Secondary | ICD-10-CM | POA: Diagnosis not present

## 2017-05-22 DIAGNOSIS — I10 Essential (primary) hypertension: Secondary | ICD-10-CM | POA: Diagnosis not present

## 2017-05-22 DIAGNOSIS — R159 Full incontinence of feces: Secondary | ICD-10-CM | POA: Diagnosis not present

## 2017-05-22 DIAGNOSIS — E039 Hypothyroidism, unspecified: Secondary | ICD-10-CM | POA: Diagnosis not present

## 2017-05-22 DIAGNOSIS — G309 Alzheimer's disease, unspecified: Secondary | ICD-10-CM | POA: Diagnosis not present

## 2017-05-22 DIAGNOSIS — F419 Anxiety disorder, unspecified: Secondary | ICD-10-CM | POA: Diagnosis not present

## 2017-05-25 DIAGNOSIS — R32 Unspecified urinary incontinence: Secondary | ICD-10-CM | POA: Diagnosis not present

## 2017-05-25 DIAGNOSIS — F419 Anxiety disorder, unspecified: Secondary | ICD-10-CM | POA: Diagnosis not present

## 2017-05-25 DIAGNOSIS — E039 Hypothyroidism, unspecified: Secondary | ICD-10-CM | POA: Diagnosis not present

## 2017-05-25 DIAGNOSIS — R159 Full incontinence of feces: Secondary | ICD-10-CM | POA: Diagnosis not present

## 2017-05-25 DIAGNOSIS — I10 Essential (primary) hypertension: Secondary | ICD-10-CM | POA: Diagnosis not present

## 2017-05-25 DIAGNOSIS — G309 Alzheimer's disease, unspecified: Secondary | ICD-10-CM | POA: Diagnosis not present

## 2017-05-27 DIAGNOSIS — R32 Unspecified urinary incontinence: Secondary | ICD-10-CM | POA: Diagnosis not present

## 2017-05-27 DIAGNOSIS — F419 Anxiety disorder, unspecified: Secondary | ICD-10-CM | POA: Diagnosis not present

## 2017-05-27 DIAGNOSIS — G309 Alzheimer's disease, unspecified: Secondary | ICD-10-CM | POA: Diagnosis not present

## 2017-05-27 DIAGNOSIS — R159 Full incontinence of feces: Secondary | ICD-10-CM | POA: Diagnosis not present

## 2017-05-27 DIAGNOSIS — I10 Essential (primary) hypertension: Secondary | ICD-10-CM | POA: Diagnosis not present

## 2017-05-27 DIAGNOSIS — E039 Hypothyroidism, unspecified: Secondary | ICD-10-CM | POA: Diagnosis not present

## 2017-05-29 DIAGNOSIS — E039 Hypothyroidism, unspecified: Secondary | ICD-10-CM | POA: Diagnosis not present

## 2017-05-29 DIAGNOSIS — F419 Anxiety disorder, unspecified: Secondary | ICD-10-CM | POA: Diagnosis not present

## 2017-05-29 DIAGNOSIS — R159 Full incontinence of feces: Secondary | ICD-10-CM | POA: Diagnosis not present

## 2017-05-29 DIAGNOSIS — R32 Unspecified urinary incontinence: Secondary | ICD-10-CM | POA: Diagnosis not present

## 2017-05-29 DIAGNOSIS — G309 Alzheimer's disease, unspecified: Secondary | ICD-10-CM | POA: Diagnosis not present

## 2017-05-29 DIAGNOSIS — I10 Essential (primary) hypertension: Secondary | ICD-10-CM | POA: Diagnosis not present

## 2017-06-01 DIAGNOSIS — R32 Unspecified urinary incontinence: Secondary | ICD-10-CM | POA: Diagnosis not present

## 2017-06-01 DIAGNOSIS — F419 Anxiety disorder, unspecified: Secondary | ICD-10-CM | POA: Diagnosis not present

## 2017-06-01 DIAGNOSIS — R159 Full incontinence of feces: Secondary | ICD-10-CM | POA: Diagnosis not present

## 2017-06-01 DIAGNOSIS — I10 Essential (primary) hypertension: Secondary | ICD-10-CM | POA: Diagnosis not present

## 2017-06-01 DIAGNOSIS — G309 Alzheimer's disease, unspecified: Secondary | ICD-10-CM | POA: Diagnosis not present

## 2017-06-01 DIAGNOSIS — E039 Hypothyroidism, unspecified: Secondary | ICD-10-CM | POA: Diagnosis not present

## 2017-06-03 DIAGNOSIS — F419 Anxiety disorder, unspecified: Secondary | ICD-10-CM | POA: Diagnosis not present

## 2017-06-03 DIAGNOSIS — E039 Hypothyroidism, unspecified: Secondary | ICD-10-CM | POA: Diagnosis not present

## 2017-06-03 DIAGNOSIS — G309 Alzheimer's disease, unspecified: Secondary | ICD-10-CM | POA: Diagnosis not present

## 2017-06-03 DIAGNOSIS — R159 Full incontinence of feces: Secondary | ICD-10-CM | POA: Diagnosis not present

## 2017-06-03 DIAGNOSIS — R32 Unspecified urinary incontinence: Secondary | ICD-10-CM | POA: Diagnosis not present

## 2017-06-03 DIAGNOSIS — I10 Essential (primary) hypertension: Secondary | ICD-10-CM | POA: Diagnosis not present

## 2017-06-05 DIAGNOSIS — G309 Alzheimer's disease, unspecified: Secondary | ICD-10-CM | POA: Diagnosis not present

## 2017-06-05 DIAGNOSIS — R32 Unspecified urinary incontinence: Secondary | ICD-10-CM | POA: Diagnosis not present

## 2017-06-05 DIAGNOSIS — F419 Anxiety disorder, unspecified: Secondary | ICD-10-CM | POA: Diagnosis not present

## 2017-06-05 DIAGNOSIS — E039 Hypothyroidism, unspecified: Secondary | ICD-10-CM | POA: Diagnosis not present

## 2017-06-05 DIAGNOSIS — I10 Essential (primary) hypertension: Secondary | ICD-10-CM | POA: Diagnosis not present

## 2017-06-05 DIAGNOSIS — R159 Full incontinence of feces: Secondary | ICD-10-CM | POA: Diagnosis not present

## 2017-06-08 DIAGNOSIS — R159 Full incontinence of feces: Secondary | ICD-10-CM | POA: Diagnosis not present

## 2017-06-08 DIAGNOSIS — I10 Essential (primary) hypertension: Secondary | ICD-10-CM | POA: Diagnosis not present

## 2017-06-08 DIAGNOSIS — F419 Anxiety disorder, unspecified: Secondary | ICD-10-CM | POA: Diagnosis not present

## 2017-06-08 DIAGNOSIS — E039 Hypothyroidism, unspecified: Secondary | ICD-10-CM | POA: Diagnosis not present

## 2017-06-08 DIAGNOSIS — G309 Alzheimer's disease, unspecified: Secondary | ICD-10-CM | POA: Diagnosis not present

## 2017-06-08 DIAGNOSIS — R32 Unspecified urinary incontinence: Secondary | ICD-10-CM | POA: Diagnosis not present

## 2017-06-10 DIAGNOSIS — G309 Alzheimer's disease, unspecified: Secondary | ICD-10-CM | POA: Diagnosis not present

## 2017-06-10 DIAGNOSIS — F419 Anxiety disorder, unspecified: Secondary | ICD-10-CM | POA: Diagnosis not present

## 2017-06-10 DIAGNOSIS — R32 Unspecified urinary incontinence: Secondary | ICD-10-CM | POA: Diagnosis not present

## 2017-06-10 DIAGNOSIS — I10 Essential (primary) hypertension: Secondary | ICD-10-CM | POA: Diagnosis not present

## 2017-06-10 DIAGNOSIS — R159 Full incontinence of feces: Secondary | ICD-10-CM | POA: Diagnosis not present

## 2017-06-10 DIAGNOSIS — E039 Hypothyroidism, unspecified: Secondary | ICD-10-CM | POA: Diagnosis not present

## 2017-06-12 DIAGNOSIS — R159 Full incontinence of feces: Secondary | ICD-10-CM | POA: Diagnosis not present

## 2017-06-12 DIAGNOSIS — R32 Unspecified urinary incontinence: Secondary | ICD-10-CM | POA: Diagnosis not present

## 2017-06-12 DIAGNOSIS — F419 Anxiety disorder, unspecified: Secondary | ICD-10-CM | POA: Diagnosis not present

## 2017-06-12 DIAGNOSIS — G309 Alzheimer's disease, unspecified: Secondary | ICD-10-CM | POA: Diagnosis not present

## 2017-06-12 DIAGNOSIS — E039 Hypothyroidism, unspecified: Secondary | ICD-10-CM | POA: Diagnosis not present

## 2017-06-12 DIAGNOSIS — I10 Essential (primary) hypertension: Secondary | ICD-10-CM | POA: Diagnosis not present

## 2017-06-13 DIAGNOSIS — G309 Alzheimer's disease, unspecified: Secondary | ICD-10-CM | POA: Diagnosis not present

## 2017-06-13 DIAGNOSIS — R159 Full incontinence of feces: Secondary | ICD-10-CM | POA: Diagnosis not present

## 2017-06-13 DIAGNOSIS — E039 Hypothyroidism, unspecified: Secondary | ICD-10-CM | POA: Diagnosis not present

## 2017-06-13 DIAGNOSIS — Z515 Encounter for palliative care: Secondary | ICD-10-CM | POA: Diagnosis not present

## 2017-06-13 DIAGNOSIS — F419 Anxiety disorder, unspecified: Secondary | ICD-10-CM | POA: Diagnosis not present

## 2017-06-13 DIAGNOSIS — I10 Essential (primary) hypertension: Secondary | ICD-10-CM | POA: Diagnosis not present

## 2017-06-13 DIAGNOSIS — R32 Unspecified urinary incontinence: Secondary | ICD-10-CM | POA: Diagnosis not present

## 2017-06-16 DIAGNOSIS — G309 Alzheimer's disease, unspecified: Secondary | ICD-10-CM | POA: Diagnosis not present

## 2017-06-16 DIAGNOSIS — F419 Anxiety disorder, unspecified: Secondary | ICD-10-CM | POA: Diagnosis not present

## 2017-06-16 DIAGNOSIS — E039 Hypothyroidism, unspecified: Secondary | ICD-10-CM | POA: Diagnosis not present

## 2017-06-16 DIAGNOSIS — R159 Full incontinence of feces: Secondary | ICD-10-CM | POA: Diagnosis not present

## 2017-06-16 DIAGNOSIS — R32 Unspecified urinary incontinence: Secondary | ICD-10-CM | POA: Diagnosis not present

## 2017-06-16 DIAGNOSIS — I10 Essential (primary) hypertension: Secondary | ICD-10-CM | POA: Diagnosis not present

## 2017-06-17 DIAGNOSIS — F419 Anxiety disorder, unspecified: Secondary | ICD-10-CM | POA: Diagnosis not present

## 2017-06-17 DIAGNOSIS — G309 Alzheimer's disease, unspecified: Secondary | ICD-10-CM | POA: Diagnosis not present

## 2017-06-17 DIAGNOSIS — E039 Hypothyroidism, unspecified: Secondary | ICD-10-CM | POA: Diagnosis not present

## 2017-06-17 DIAGNOSIS — R32 Unspecified urinary incontinence: Secondary | ICD-10-CM | POA: Diagnosis not present

## 2017-06-17 DIAGNOSIS — R159 Full incontinence of feces: Secondary | ICD-10-CM | POA: Diagnosis not present

## 2017-06-17 DIAGNOSIS — I10 Essential (primary) hypertension: Secondary | ICD-10-CM | POA: Diagnosis not present

## 2017-06-19 DIAGNOSIS — I10 Essential (primary) hypertension: Secondary | ICD-10-CM | POA: Diagnosis not present

## 2017-06-19 DIAGNOSIS — R159 Full incontinence of feces: Secondary | ICD-10-CM | POA: Diagnosis not present

## 2017-06-19 DIAGNOSIS — R32 Unspecified urinary incontinence: Secondary | ICD-10-CM | POA: Diagnosis not present

## 2017-06-19 DIAGNOSIS — G309 Alzheimer's disease, unspecified: Secondary | ICD-10-CM | POA: Diagnosis not present

## 2017-06-19 DIAGNOSIS — F419 Anxiety disorder, unspecified: Secondary | ICD-10-CM | POA: Diagnosis not present

## 2017-06-19 DIAGNOSIS — E039 Hypothyroidism, unspecified: Secondary | ICD-10-CM | POA: Diagnosis not present

## 2017-06-22 DIAGNOSIS — E039 Hypothyroidism, unspecified: Secondary | ICD-10-CM | POA: Diagnosis not present

## 2017-06-22 DIAGNOSIS — G309 Alzheimer's disease, unspecified: Secondary | ICD-10-CM | POA: Diagnosis not present

## 2017-06-22 DIAGNOSIS — I10 Essential (primary) hypertension: Secondary | ICD-10-CM | POA: Diagnosis not present

## 2017-06-22 DIAGNOSIS — R32 Unspecified urinary incontinence: Secondary | ICD-10-CM | POA: Diagnosis not present

## 2017-06-22 DIAGNOSIS — F419 Anxiety disorder, unspecified: Secondary | ICD-10-CM | POA: Diagnosis not present

## 2017-06-22 DIAGNOSIS — R159 Full incontinence of feces: Secondary | ICD-10-CM | POA: Diagnosis not present

## 2017-06-24 DIAGNOSIS — F419 Anxiety disorder, unspecified: Secondary | ICD-10-CM | POA: Diagnosis not present

## 2017-06-24 DIAGNOSIS — G309 Alzheimer's disease, unspecified: Secondary | ICD-10-CM | POA: Diagnosis not present

## 2017-06-24 DIAGNOSIS — R32 Unspecified urinary incontinence: Secondary | ICD-10-CM | POA: Diagnosis not present

## 2017-06-24 DIAGNOSIS — E039 Hypothyroidism, unspecified: Secondary | ICD-10-CM | POA: Diagnosis not present

## 2017-06-24 DIAGNOSIS — R159 Full incontinence of feces: Secondary | ICD-10-CM | POA: Diagnosis not present

## 2017-06-24 DIAGNOSIS — I10 Essential (primary) hypertension: Secondary | ICD-10-CM | POA: Diagnosis not present

## 2017-06-29 DIAGNOSIS — G309 Alzheimer's disease, unspecified: Secondary | ICD-10-CM | POA: Diagnosis not present

## 2017-06-29 DIAGNOSIS — E039 Hypothyroidism, unspecified: Secondary | ICD-10-CM | POA: Diagnosis not present

## 2017-06-29 DIAGNOSIS — F419 Anxiety disorder, unspecified: Secondary | ICD-10-CM | POA: Diagnosis not present

## 2017-06-29 DIAGNOSIS — R32 Unspecified urinary incontinence: Secondary | ICD-10-CM | POA: Diagnosis not present

## 2017-06-29 DIAGNOSIS — R159 Full incontinence of feces: Secondary | ICD-10-CM | POA: Diagnosis not present

## 2017-06-29 DIAGNOSIS — I10 Essential (primary) hypertension: Secondary | ICD-10-CM | POA: Diagnosis not present

## 2017-07-01 DIAGNOSIS — F419 Anxiety disorder, unspecified: Secondary | ICD-10-CM | POA: Diagnosis not present

## 2017-07-01 DIAGNOSIS — E039 Hypothyroidism, unspecified: Secondary | ICD-10-CM | POA: Diagnosis not present

## 2017-07-01 DIAGNOSIS — R159 Full incontinence of feces: Secondary | ICD-10-CM | POA: Diagnosis not present

## 2017-07-01 DIAGNOSIS — G309 Alzheimer's disease, unspecified: Secondary | ICD-10-CM | POA: Diagnosis not present

## 2017-07-01 DIAGNOSIS — R32 Unspecified urinary incontinence: Secondary | ICD-10-CM | POA: Diagnosis not present

## 2017-07-01 DIAGNOSIS — I10 Essential (primary) hypertension: Secondary | ICD-10-CM | POA: Diagnosis not present

## 2017-07-03 DIAGNOSIS — F419 Anxiety disorder, unspecified: Secondary | ICD-10-CM | POA: Diagnosis not present

## 2017-07-03 DIAGNOSIS — R159 Full incontinence of feces: Secondary | ICD-10-CM | POA: Diagnosis not present

## 2017-07-03 DIAGNOSIS — R32 Unspecified urinary incontinence: Secondary | ICD-10-CM | POA: Diagnosis not present

## 2017-07-03 DIAGNOSIS — E039 Hypothyroidism, unspecified: Secondary | ICD-10-CM | POA: Diagnosis not present

## 2017-07-03 DIAGNOSIS — I10 Essential (primary) hypertension: Secondary | ICD-10-CM | POA: Diagnosis not present

## 2017-07-03 DIAGNOSIS — G309 Alzheimer's disease, unspecified: Secondary | ICD-10-CM | POA: Diagnosis not present

## 2017-07-06 DIAGNOSIS — R159 Full incontinence of feces: Secondary | ICD-10-CM | POA: Diagnosis not present

## 2017-07-06 DIAGNOSIS — E039 Hypothyroidism, unspecified: Secondary | ICD-10-CM | POA: Diagnosis not present

## 2017-07-06 DIAGNOSIS — I10 Essential (primary) hypertension: Secondary | ICD-10-CM | POA: Diagnosis not present

## 2017-07-06 DIAGNOSIS — G309 Alzheimer's disease, unspecified: Secondary | ICD-10-CM | POA: Diagnosis not present

## 2017-07-06 DIAGNOSIS — R32 Unspecified urinary incontinence: Secondary | ICD-10-CM | POA: Diagnosis not present

## 2017-07-06 DIAGNOSIS — F419 Anxiety disorder, unspecified: Secondary | ICD-10-CM | POA: Diagnosis not present

## 2017-07-08 DIAGNOSIS — G309 Alzheimer's disease, unspecified: Secondary | ICD-10-CM | POA: Diagnosis not present

## 2017-07-08 DIAGNOSIS — R159 Full incontinence of feces: Secondary | ICD-10-CM | POA: Diagnosis not present

## 2017-07-08 DIAGNOSIS — R32 Unspecified urinary incontinence: Secondary | ICD-10-CM | POA: Diagnosis not present

## 2017-07-08 DIAGNOSIS — E039 Hypothyroidism, unspecified: Secondary | ICD-10-CM | POA: Diagnosis not present

## 2017-07-08 DIAGNOSIS — I10 Essential (primary) hypertension: Secondary | ICD-10-CM | POA: Diagnosis not present

## 2017-07-08 DIAGNOSIS — F419 Anxiety disorder, unspecified: Secondary | ICD-10-CM | POA: Diagnosis not present

## 2017-07-10 DIAGNOSIS — F419 Anxiety disorder, unspecified: Secondary | ICD-10-CM | POA: Diagnosis not present

## 2017-07-10 DIAGNOSIS — R32 Unspecified urinary incontinence: Secondary | ICD-10-CM | POA: Diagnosis not present

## 2017-07-10 DIAGNOSIS — I10 Essential (primary) hypertension: Secondary | ICD-10-CM | POA: Diagnosis not present

## 2017-07-10 DIAGNOSIS — R159 Full incontinence of feces: Secondary | ICD-10-CM | POA: Diagnosis not present

## 2017-07-10 DIAGNOSIS — E039 Hypothyroidism, unspecified: Secondary | ICD-10-CM | POA: Diagnosis not present

## 2017-07-10 DIAGNOSIS — G309 Alzheimer's disease, unspecified: Secondary | ICD-10-CM | POA: Diagnosis not present

## 2017-07-13 DIAGNOSIS — I10 Essential (primary) hypertension: Secondary | ICD-10-CM | POA: Diagnosis not present

## 2017-07-13 DIAGNOSIS — E039 Hypothyroidism, unspecified: Secondary | ICD-10-CM | POA: Diagnosis not present

## 2017-07-13 DIAGNOSIS — R32 Unspecified urinary incontinence: Secondary | ICD-10-CM | POA: Diagnosis not present

## 2017-07-13 DIAGNOSIS — R159 Full incontinence of feces: Secondary | ICD-10-CM | POA: Diagnosis not present

## 2017-07-13 DIAGNOSIS — F419 Anxiety disorder, unspecified: Secondary | ICD-10-CM | POA: Diagnosis not present

## 2017-07-13 DIAGNOSIS — Z515 Encounter for palliative care: Secondary | ICD-10-CM | POA: Diagnosis not present

## 2017-07-13 DIAGNOSIS — G309 Alzheimer's disease, unspecified: Secondary | ICD-10-CM | POA: Diagnosis not present

## 2017-07-15 DIAGNOSIS — F419 Anxiety disorder, unspecified: Secondary | ICD-10-CM | POA: Diagnosis not present

## 2017-07-15 DIAGNOSIS — R159 Full incontinence of feces: Secondary | ICD-10-CM | POA: Diagnosis not present

## 2017-07-15 DIAGNOSIS — I10 Essential (primary) hypertension: Secondary | ICD-10-CM | POA: Diagnosis not present

## 2017-07-15 DIAGNOSIS — G309 Alzheimer's disease, unspecified: Secondary | ICD-10-CM | POA: Diagnosis not present

## 2017-07-15 DIAGNOSIS — R32 Unspecified urinary incontinence: Secondary | ICD-10-CM | POA: Diagnosis not present

## 2017-07-15 DIAGNOSIS — E039 Hypothyroidism, unspecified: Secondary | ICD-10-CM | POA: Diagnosis not present

## 2017-07-17 DIAGNOSIS — E039 Hypothyroidism, unspecified: Secondary | ICD-10-CM | POA: Diagnosis not present

## 2017-07-17 DIAGNOSIS — R159 Full incontinence of feces: Secondary | ICD-10-CM | POA: Diagnosis not present

## 2017-07-17 DIAGNOSIS — G309 Alzheimer's disease, unspecified: Secondary | ICD-10-CM | POA: Diagnosis not present

## 2017-07-17 DIAGNOSIS — I10 Essential (primary) hypertension: Secondary | ICD-10-CM | POA: Diagnosis not present

## 2017-07-17 DIAGNOSIS — F419 Anxiety disorder, unspecified: Secondary | ICD-10-CM | POA: Diagnosis not present

## 2017-07-17 DIAGNOSIS — R32 Unspecified urinary incontinence: Secondary | ICD-10-CM | POA: Diagnosis not present

## 2017-07-20 DIAGNOSIS — R159 Full incontinence of feces: Secondary | ICD-10-CM | POA: Diagnosis not present

## 2017-07-20 DIAGNOSIS — I10 Essential (primary) hypertension: Secondary | ICD-10-CM | POA: Diagnosis not present

## 2017-07-20 DIAGNOSIS — R32 Unspecified urinary incontinence: Secondary | ICD-10-CM | POA: Diagnosis not present

## 2017-07-20 DIAGNOSIS — G309 Alzheimer's disease, unspecified: Secondary | ICD-10-CM | POA: Diagnosis not present

## 2017-07-20 DIAGNOSIS — F419 Anxiety disorder, unspecified: Secondary | ICD-10-CM | POA: Diagnosis not present

## 2017-07-20 DIAGNOSIS — E039 Hypothyroidism, unspecified: Secondary | ICD-10-CM | POA: Diagnosis not present

## 2017-07-22 DIAGNOSIS — R159 Full incontinence of feces: Secondary | ICD-10-CM | POA: Diagnosis not present

## 2017-07-22 DIAGNOSIS — E039 Hypothyroidism, unspecified: Secondary | ICD-10-CM | POA: Diagnosis not present

## 2017-07-22 DIAGNOSIS — F419 Anxiety disorder, unspecified: Secondary | ICD-10-CM | POA: Diagnosis not present

## 2017-07-22 DIAGNOSIS — G309 Alzheimer's disease, unspecified: Secondary | ICD-10-CM | POA: Diagnosis not present

## 2017-07-22 DIAGNOSIS — I10 Essential (primary) hypertension: Secondary | ICD-10-CM | POA: Diagnosis not present

## 2017-07-22 DIAGNOSIS — R32 Unspecified urinary incontinence: Secondary | ICD-10-CM | POA: Diagnosis not present

## 2017-07-23 DIAGNOSIS — R159 Full incontinence of feces: Secondary | ICD-10-CM | POA: Diagnosis not present

## 2017-07-23 DIAGNOSIS — F419 Anxiety disorder, unspecified: Secondary | ICD-10-CM | POA: Diagnosis not present

## 2017-07-23 DIAGNOSIS — I10 Essential (primary) hypertension: Secondary | ICD-10-CM | POA: Diagnosis not present

## 2017-07-23 DIAGNOSIS — E039 Hypothyroidism, unspecified: Secondary | ICD-10-CM | POA: Diagnosis not present

## 2017-07-23 DIAGNOSIS — G309 Alzheimer's disease, unspecified: Secondary | ICD-10-CM | POA: Diagnosis not present

## 2017-07-23 DIAGNOSIS — R32 Unspecified urinary incontinence: Secondary | ICD-10-CM | POA: Diagnosis not present

## 2017-07-24 DIAGNOSIS — F419 Anxiety disorder, unspecified: Secondary | ICD-10-CM | POA: Diagnosis not present

## 2017-07-24 DIAGNOSIS — R32 Unspecified urinary incontinence: Secondary | ICD-10-CM | POA: Diagnosis not present

## 2017-07-24 DIAGNOSIS — R159 Full incontinence of feces: Secondary | ICD-10-CM | POA: Diagnosis not present

## 2017-07-24 DIAGNOSIS — I10 Essential (primary) hypertension: Secondary | ICD-10-CM | POA: Diagnosis not present

## 2017-07-24 DIAGNOSIS — G309 Alzheimer's disease, unspecified: Secondary | ICD-10-CM | POA: Diagnosis not present

## 2017-07-24 DIAGNOSIS — E039 Hypothyroidism, unspecified: Secondary | ICD-10-CM | POA: Diagnosis not present

## 2017-07-27 DIAGNOSIS — I10 Essential (primary) hypertension: Secondary | ICD-10-CM | POA: Diagnosis not present

## 2017-07-27 DIAGNOSIS — G309 Alzheimer's disease, unspecified: Secondary | ICD-10-CM | POA: Diagnosis not present

## 2017-07-27 DIAGNOSIS — F419 Anxiety disorder, unspecified: Secondary | ICD-10-CM | POA: Diagnosis not present

## 2017-07-27 DIAGNOSIS — E039 Hypothyroidism, unspecified: Secondary | ICD-10-CM | POA: Diagnosis not present

## 2017-07-27 DIAGNOSIS — R32 Unspecified urinary incontinence: Secondary | ICD-10-CM | POA: Diagnosis not present

## 2017-07-27 DIAGNOSIS — R159 Full incontinence of feces: Secondary | ICD-10-CM | POA: Diagnosis not present

## 2017-07-29 DIAGNOSIS — F419 Anxiety disorder, unspecified: Secondary | ICD-10-CM | POA: Diagnosis not present

## 2017-07-29 DIAGNOSIS — E039 Hypothyroidism, unspecified: Secondary | ICD-10-CM | POA: Diagnosis not present

## 2017-07-29 DIAGNOSIS — I10 Essential (primary) hypertension: Secondary | ICD-10-CM | POA: Diagnosis not present

## 2017-07-29 DIAGNOSIS — R32 Unspecified urinary incontinence: Secondary | ICD-10-CM | POA: Diagnosis not present

## 2017-07-29 DIAGNOSIS — R159 Full incontinence of feces: Secondary | ICD-10-CM | POA: Diagnosis not present

## 2017-07-29 DIAGNOSIS — G309 Alzheimer's disease, unspecified: Secondary | ICD-10-CM | POA: Diagnosis not present

## 2017-07-31 DIAGNOSIS — R32 Unspecified urinary incontinence: Secondary | ICD-10-CM | POA: Diagnosis not present

## 2017-07-31 DIAGNOSIS — E039 Hypothyroidism, unspecified: Secondary | ICD-10-CM | POA: Diagnosis not present

## 2017-07-31 DIAGNOSIS — F419 Anxiety disorder, unspecified: Secondary | ICD-10-CM | POA: Diagnosis not present

## 2017-07-31 DIAGNOSIS — R159 Full incontinence of feces: Secondary | ICD-10-CM | POA: Diagnosis not present

## 2017-07-31 DIAGNOSIS — I10 Essential (primary) hypertension: Secondary | ICD-10-CM | POA: Diagnosis not present

## 2017-07-31 DIAGNOSIS — G309 Alzheimer's disease, unspecified: Secondary | ICD-10-CM | POA: Diagnosis not present

## 2017-08-03 DIAGNOSIS — G309 Alzheimer's disease, unspecified: Secondary | ICD-10-CM | POA: Diagnosis not present

## 2017-08-03 DIAGNOSIS — E039 Hypothyroidism, unspecified: Secondary | ICD-10-CM | POA: Diagnosis not present

## 2017-08-03 DIAGNOSIS — R159 Full incontinence of feces: Secondary | ICD-10-CM | POA: Diagnosis not present

## 2017-08-03 DIAGNOSIS — F419 Anxiety disorder, unspecified: Secondary | ICD-10-CM | POA: Diagnosis not present

## 2017-08-03 DIAGNOSIS — R32 Unspecified urinary incontinence: Secondary | ICD-10-CM | POA: Diagnosis not present

## 2017-08-03 DIAGNOSIS — I10 Essential (primary) hypertension: Secondary | ICD-10-CM | POA: Diagnosis not present

## 2017-08-05 DIAGNOSIS — R159 Full incontinence of feces: Secondary | ICD-10-CM | POA: Diagnosis not present

## 2017-08-05 DIAGNOSIS — I10 Essential (primary) hypertension: Secondary | ICD-10-CM | POA: Diagnosis not present

## 2017-08-05 DIAGNOSIS — R32 Unspecified urinary incontinence: Secondary | ICD-10-CM | POA: Diagnosis not present

## 2017-08-05 DIAGNOSIS — E039 Hypothyroidism, unspecified: Secondary | ICD-10-CM | POA: Diagnosis not present

## 2017-08-05 DIAGNOSIS — F419 Anxiety disorder, unspecified: Secondary | ICD-10-CM | POA: Diagnosis not present

## 2017-08-05 DIAGNOSIS — G309 Alzheimer's disease, unspecified: Secondary | ICD-10-CM | POA: Diagnosis not present

## 2017-08-07 DIAGNOSIS — E039 Hypothyroidism, unspecified: Secondary | ICD-10-CM | POA: Diagnosis not present

## 2017-08-07 DIAGNOSIS — R32 Unspecified urinary incontinence: Secondary | ICD-10-CM | POA: Diagnosis not present

## 2017-08-07 DIAGNOSIS — R159 Full incontinence of feces: Secondary | ICD-10-CM | POA: Diagnosis not present

## 2017-08-07 DIAGNOSIS — F419 Anxiety disorder, unspecified: Secondary | ICD-10-CM | POA: Diagnosis not present

## 2017-08-07 DIAGNOSIS — I10 Essential (primary) hypertension: Secondary | ICD-10-CM | POA: Diagnosis not present

## 2017-08-07 DIAGNOSIS — G309 Alzheimer's disease, unspecified: Secondary | ICD-10-CM | POA: Diagnosis not present

## 2017-08-10 DIAGNOSIS — I10 Essential (primary) hypertension: Secondary | ICD-10-CM | POA: Diagnosis not present

## 2017-08-10 DIAGNOSIS — R32 Unspecified urinary incontinence: Secondary | ICD-10-CM | POA: Diagnosis not present

## 2017-08-10 DIAGNOSIS — R159 Full incontinence of feces: Secondary | ICD-10-CM | POA: Diagnosis not present

## 2017-08-10 DIAGNOSIS — G309 Alzheimer's disease, unspecified: Secondary | ICD-10-CM | POA: Diagnosis not present

## 2017-08-10 DIAGNOSIS — E039 Hypothyroidism, unspecified: Secondary | ICD-10-CM | POA: Diagnosis not present

## 2017-08-10 DIAGNOSIS — F419 Anxiety disorder, unspecified: Secondary | ICD-10-CM | POA: Diagnosis not present

## 2017-08-12 DIAGNOSIS — R159 Full incontinence of feces: Secondary | ICD-10-CM | POA: Diagnosis not present

## 2017-08-12 DIAGNOSIS — I10 Essential (primary) hypertension: Secondary | ICD-10-CM | POA: Diagnosis not present

## 2017-08-12 DIAGNOSIS — R32 Unspecified urinary incontinence: Secondary | ICD-10-CM | POA: Diagnosis not present

## 2017-08-12 DIAGNOSIS — F419 Anxiety disorder, unspecified: Secondary | ICD-10-CM | POA: Diagnosis not present

## 2017-08-12 DIAGNOSIS — E039 Hypothyroidism, unspecified: Secondary | ICD-10-CM | POA: Diagnosis not present

## 2017-08-12 DIAGNOSIS — G309 Alzheimer's disease, unspecified: Secondary | ICD-10-CM | POA: Diagnosis not present

## 2017-08-13 DIAGNOSIS — F419 Anxiety disorder, unspecified: Secondary | ICD-10-CM | POA: Diagnosis not present

## 2017-08-13 DIAGNOSIS — R32 Unspecified urinary incontinence: Secondary | ICD-10-CM | POA: Diagnosis not present

## 2017-08-13 DIAGNOSIS — E039 Hypothyroidism, unspecified: Secondary | ICD-10-CM | POA: Diagnosis not present

## 2017-08-13 DIAGNOSIS — Z515 Encounter for palliative care: Secondary | ICD-10-CM | POA: Diagnosis not present

## 2017-08-13 DIAGNOSIS — R159 Full incontinence of feces: Secondary | ICD-10-CM | POA: Diagnosis not present

## 2017-08-13 DIAGNOSIS — G309 Alzheimer's disease, unspecified: Secondary | ICD-10-CM | POA: Diagnosis not present

## 2017-08-13 DIAGNOSIS — I10 Essential (primary) hypertension: Secondary | ICD-10-CM | POA: Diagnosis not present

## 2017-08-14 DIAGNOSIS — R32 Unspecified urinary incontinence: Secondary | ICD-10-CM | POA: Diagnosis not present

## 2017-08-14 DIAGNOSIS — E039 Hypothyroidism, unspecified: Secondary | ICD-10-CM | POA: Diagnosis not present

## 2017-08-14 DIAGNOSIS — F419 Anxiety disorder, unspecified: Secondary | ICD-10-CM | POA: Diagnosis not present

## 2017-08-14 DIAGNOSIS — R159 Full incontinence of feces: Secondary | ICD-10-CM | POA: Diagnosis not present

## 2017-08-14 DIAGNOSIS — I10 Essential (primary) hypertension: Secondary | ICD-10-CM | POA: Diagnosis not present

## 2017-08-14 DIAGNOSIS — G309 Alzheimer's disease, unspecified: Secondary | ICD-10-CM | POA: Diagnosis not present

## 2017-08-17 DIAGNOSIS — I10 Essential (primary) hypertension: Secondary | ICD-10-CM | POA: Diagnosis not present

## 2017-08-17 DIAGNOSIS — F419 Anxiety disorder, unspecified: Secondary | ICD-10-CM | POA: Diagnosis not present

## 2017-08-17 DIAGNOSIS — R159 Full incontinence of feces: Secondary | ICD-10-CM | POA: Diagnosis not present

## 2017-08-17 DIAGNOSIS — G309 Alzheimer's disease, unspecified: Secondary | ICD-10-CM | POA: Diagnosis not present

## 2017-08-17 DIAGNOSIS — E039 Hypothyroidism, unspecified: Secondary | ICD-10-CM | POA: Diagnosis not present

## 2017-08-17 DIAGNOSIS — R32 Unspecified urinary incontinence: Secondary | ICD-10-CM | POA: Diagnosis not present

## 2017-08-19 DIAGNOSIS — R159 Full incontinence of feces: Secondary | ICD-10-CM | POA: Diagnosis not present

## 2017-08-19 DIAGNOSIS — G309 Alzheimer's disease, unspecified: Secondary | ICD-10-CM | POA: Diagnosis not present

## 2017-08-19 DIAGNOSIS — E039 Hypothyroidism, unspecified: Secondary | ICD-10-CM | POA: Diagnosis not present

## 2017-08-19 DIAGNOSIS — F419 Anxiety disorder, unspecified: Secondary | ICD-10-CM | POA: Diagnosis not present

## 2017-08-19 DIAGNOSIS — I10 Essential (primary) hypertension: Secondary | ICD-10-CM | POA: Diagnosis not present

## 2017-08-19 DIAGNOSIS — R32 Unspecified urinary incontinence: Secondary | ICD-10-CM | POA: Diagnosis not present

## 2017-08-20 DIAGNOSIS — F419 Anxiety disorder, unspecified: Secondary | ICD-10-CM | POA: Diagnosis not present

## 2017-08-20 DIAGNOSIS — E039 Hypothyroidism, unspecified: Secondary | ICD-10-CM | POA: Diagnosis not present

## 2017-08-20 DIAGNOSIS — R32 Unspecified urinary incontinence: Secondary | ICD-10-CM | POA: Diagnosis not present

## 2017-08-20 DIAGNOSIS — R159 Full incontinence of feces: Secondary | ICD-10-CM | POA: Diagnosis not present

## 2017-08-20 DIAGNOSIS — G309 Alzheimer's disease, unspecified: Secondary | ICD-10-CM | POA: Diagnosis not present

## 2017-08-20 DIAGNOSIS — I10 Essential (primary) hypertension: Secondary | ICD-10-CM | POA: Diagnosis not present

## 2017-08-24 DIAGNOSIS — R32 Unspecified urinary incontinence: Secondary | ICD-10-CM | POA: Diagnosis not present

## 2017-08-24 DIAGNOSIS — F419 Anxiety disorder, unspecified: Secondary | ICD-10-CM | POA: Diagnosis not present

## 2017-08-24 DIAGNOSIS — G309 Alzheimer's disease, unspecified: Secondary | ICD-10-CM | POA: Diagnosis not present

## 2017-08-24 DIAGNOSIS — R159 Full incontinence of feces: Secondary | ICD-10-CM | POA: Diagnosis not present

## 2017-08-24 DIAGNOSIS — I10 Essential (primary) hypertension: Secondary | ICD-10-CM | POA: Diagnosis not present

## 2017-08-24 DIAGNOSIS — E039 Hypothyroidism, unspecified: Secondary | ICD-10-CM | POA: Diagnosis not present

## 2017-08-26 DIAGNOSIS — R159 Full incontinence of feces: Secondary | ICD-10-CM | POA: Diagnosis not present

## 2017-08-26 DIAGNOSIS — F419 Anxiety disorder, unspecified: Secondary | ICD-10-CM | POA: Diagnosis not present

## 2017-08-26 DIAGNOSIS — R32 Unspecified urinary incontinence: Secondary | ICD-10-CM | POA: Diagnosis not present

## 2017-08-26 DIAGNOSIS — I10 Essential (primary) hypertension: Secondary | ICD-10-CM | POA: Diagnosis not present

## 2017-08-26 DIAGNOSIS — E039 Hypothyroidism, unspecified: Secondary | ICD-10-CM | POA: Diagnosis not present

## 2017-08-26 DIAGNOSIS — G309 Alzheimer's disease, unspecified: Secondary | ICD-10-CM | POA: Diagnosis not present

## 2017-08-27 DIAGNOSIS — R159 Full incontinence of feces: Secondary | ICD-10-CM | POA: Diagnosis not present

## 2017-08-27 DIAGNOSIS — R32 Unspecified urinary incontinence: Secondary | ICD-10-CM | POA: Diagnosis not present

## 2017-08-27 DIAGNOSIS — I10 Essential (primary) hypertension: Secondary | ICD-10-CM | POA: Diagnosis not present

## 2017-08-27 DIAGNOSIS — G309 Alzheimer's disease, unspecified: Secondary | ICD-10-CM | POA: Diagnosis not present

## 2017-08-27 DIAGNOSIS — E039 Hypothyroidism, unspecified: Secondary | ICD-10-CM | POA: Diagnosis not present

## 2017-08-27 DIAGNOSIS — F419 Anxiety disorder, unspecified: Secondary | ICD-10-CM | POA: Diagnosis not present

## 2017-08-28 DIAGNOSIS — I10 Essential (primary) hypertension: Secondary | ICD-10-CM | POA: Diagnosis not present

## 2017-08-28 DIAGNOSIS — G309 Alzheimer's disease, unspecified: Secondary | ICD-10-CM | POA: Diagnosis not present

## 2017-08-28 DIAGNOSIS — R159 Full incontinence of feces: Secondary | ICD-10-CM | POA: Diagnosis not present

## 2017-08-28 DIAGNOSIS — F419 Anxiety disorder, unspecified: Secondary | ICD-10-CM | POA: Diagnosis not present

## 2017-08-28 DIAGNOSIS — R32 Unspecified urinary incontinence: Secondary | ICD-10-CM | POA: Diagnosis not present

## 2017-08-28 DIAGNOSIS — E039 Hypothyroidism, unspecified: Secondary | ICD-10-CM | POA: Diagnosis not present

## 2017-08-31 DIAGNOSIS — I10 Essential (primary) hypertension: Secondary | ICD-10-CM | POA: Diagnosis not present

## 2017-08-31 DIAGNOSIS — G309 Alzheimer's disease, unspecified: Secondary | ICD-10-CM | POA: Diagnosis not present

## 2017-08-31 DIAGNOSIS — R159 Full incontinence of feces: Secondary | ICD-10-CM | POA: Diagnosis not present

## 2017-08-31 DIAGNOSIS — R32 Unspecified urinary incontinence: Secondary | ICD-10-CM | POA: Diagnosis not present

## 2017-08-31 DIAGNOSIS — F419 Anxiety disorder, unspecified: Secondary | ICD-10-CM | POA: Diagnosis not present

## 2017-08-31 DIAGNOSIS — E039 Hypothyroidism, unspecified: Secondary | ICD-10-CM | POA: Diagnosis not present

## 2017-09-02 DIAGNOSIS — I10 Essential (primary) hypertension: Secondary | ICD-10-CM | POA: Diagnosis not present

## 2017-09-02 DIAGNOSIS — R159 Full incontinence of feces: Secondary | ICD-10-CM | POA: Diagnosis not present

## 2017-09-02 DIAGNOSIS — R32 Unspecified urinary incontinence: Secondary | ICD-10-CM | POA: Diagnosis not present

## 2017-09-02 DIAGNOSIS — F419 Anxiety disorder, unspecified: Secondary | ICD-10-CM | POA: Diagnosis not present

## 2017-09-02 DIAGNOSIS — E039 Hypothyroidism, unspecified: Secondary | ICD-10-CM | POA: Diagnosis not present

## 2017-09-02 DIAGNOSIS — G309 Alzheimer's disease, unspecified: Secondary | ICD-10-CM | POA: Diagnosis not present

## 2017-09-04 DIAGNOSIS — R159 Full incontinence of feces: Secondary | ICD-10-CM | POA: Diagnosis not present

## 2017-09-04 DIAGNOSIS — E039 Hypothyroidism, unspecified: Secondary | ICD-10-CM | POA: Diagnosis not present

## 2017-09-04 DIAGNOSIS — F419 Anxiety disorder, unspecified: Secondary | ICD-10-CM | POA: Diagnosis not present

## 2017-09-04 DIAGNOSIS — I10 Essential (primary) hypertension: Secondary | ICD-10-CM | POA: Diagnosis not present

## 2017-09-04 DIAGNOSIS — R32 Unspecified urinary incontinence: Secondary | ICD-10-CM | POA: Diagnosis not present

## 2017-09-04 DIAGNOSIS — G309 Alzheimer's disease, unspecified: Secondary | ICD-10-CM | POA: Diagnosis not present

## 2017-09-07 DIAGNOSIS — R32 Unspecified urinary incontinence: Secondary | ICD-10-CM | POA: Diagnosis not present

## 2017-09-07 DIAGNOSIS — F419 Anxiety disorder, unspecified: Secondary | ICD-10-CM | POA: Diagnosis not present

## 2017-09-07 DIAGNOSIS — E039 Hypothyroidism, unspecified: Secondary | ICD-10-CM | POA: Diagnosis not present

## 2017-09-07 DIAGNOSIS — R159 Full incontinence of feces: Secondary | ICD-10-CM | POA: Diagnosis not present

## 2017-09-07 DIAGNOSIS — G309 Alzheimer's disease, unspecified: Secondary | ICD-10-CM | POA: Diagnosis not present

## 2017-09-07 DIAGNOSIS — I10 Essential (primary) hypertension: Secondary | ICD-10-CM | POA: Diagnosis not present

## 2017-09-08 DIAGNOSIS — G309 Alzheimer's disease, unspecified: Secondary | ICD-10-CM | POA: Diagnosis not present

## 2017-09-08 DIAGNOSIS — R159 Full incontinence of feces: Secondary | ICD-10-CM | POA: Diagnosis not present

## 2017-09-08 DIAGNOSIS — E039 Hypothyroidism, unspecified: Secondary | ICD-10-CM | POA: Diagnosis not present

## 2017-09-08 DIAGNOSIS — F419 Anxiety disorder, unspecified: Secondary | ICD-10-CM | POA: Diagnosis not present

## 2017-09-08 DIAGNOSIS — R32 Unspecified urinary incontinence: Secondary | ICD-10-CM | POA: Diagnosis not present

## 2017-09-08 DIAGNOSIS — I10 Essential (primary) hypertension: Secondary | ICD-10-CM | POA: Diagnosis not present

## 2017-09-09 DIAGNOSIS — R159 Full incontinence of feces: Secondary | ICD-10-CM | POA: Diagnosis not present

## 2017-09-09 DIAGNOSIS — F419 Anxiety disorder, unspecified: Secondary | ICD-10-CM | POA: Diagnosis not present

## 2017-09-09 DIAGNOSIS — R32 Unspecified urinary incontinence: Secondary | ICD-10-CM | POA: Diagnosis not present

## 2017-09-09 DIAGNOSIS — E039 Hypothyroidism, unspecified: Secondary | ICD-10-CM | POA: Diagnosis not present

## 2017-09-09 DIAGNOSIS — G309 Alzheimer's disease, unspecified: Secondary | ICD-10-CM | POA: Diagnosis not present

## 2017-09-09 DIAGNOSIS — I10 Essential (primary) hypertension: Secondary | ICD-10-CM | POA: Diagnosis not present

## 2017-09-10 DIAGNOSIS — I10 Essential (primary) hypertension: Secondary | ICD-10-CM | POA: Diagnosis not present

## 2017-09-10 DIAGNOSIS — F419 Anxiety disorder, unspecified: Secondary | ICD-10-CM | POA: Diagnosis not present

## 2017-09-10 DIAGNOSIS — E039 Hypothyroidism, unspecified: Secondary | ICD-10-CM | POA: Diagnosis not present

## 2017-09-10 DIAGNOSIS — R32 Unspecified urinary incontinence: Secondary | ICD-10-CM | POA: Diagnosis not present

## 2017-09-10 DIAGNOSIS — G309 Alzheimer's disease, unspecified: Secondary | ICD-10-CM | POA: Diagnosis not present

## 2017-09-10 DIAGNOSIS — R159 Full incontinence of feces: Secondary | ICD-10-CM | POA: Diagnosis not present

## 2017-09-11 DIAGNOSIS — G309 Alzheimer's disease, unspecified: Secondary | ICD-10-CM | POA: Diagnosis not present

## 2017-09-11 DIAGNOSIS — R159 Full incontinence of feces: Secondary | ICD-10-CM | POA: Diagnosis not present

## 2017-09-11 DIAGNOSIS — E039 Hypothyroidism, unspecified: Secondary | ICD-10-CM | POA: Diagnosis not present

## 2017-09-11 DIAGNOSIS — R32 Unspecified urinary incontinence: Secondary | ICD-10-CM | POA: Diagnosis not present

## 2017-09-11 DIAGNOSIS — I10 Essential (primary) hypertension: Secondary | ICD-10-CM | POA: Diagnosis not present

## 2017-09-11 DIAGNOSIS — F419 Anxiety disorder, unspecified: Secondary | ICD-10-CM | POA: Diagnosis not present

## 2017-09-12 DIAGNOSIS — F419 Anxiety disorder, unspecified: Secondary | ICD-10-CM | POA: Diagnosis not present

## 2017-09-12 DIAGNOSIS — Z515 Encounter for palliative care: Secondary | ICD-10-CM | POA: Diagnosis not present

## 2017-09-12 DIAGNOSIS — G309 Alzheimer's disease, unspecified: Secondary | ICD-10-CM | POA: Diagnosis not present

## 2017-09-12 DIAGNOSIS — E039 Hypothyroidism, unspecified: Secondary | ICD-10-CM | POA: Diagnosis not present

## 2017-09-12 DIAGNOSIS — I10 Essential (primary) hypertension: Secondary | ICD-10-CM | POA: Diagnosis not present

## 2017-09-12 DIAGNOSIS — R159 Full incontinence of feces: Secondary | ICD-10-CM | POA: Diagnosis not present

## 2017-09-12 DIAGNOSIS — R32 Unspecified urinary incontinence: Secondary | ICD-10-CM | POA: Diagnosis not present

## 2017-09-14 DIAGNOSIS — E039 Hypothyroidism, unspecified: Secondary | ICD-10-CM | POA: Diagnosis not present

## 2017-09-14 DIAGNOSIS — G309 Alzheimer's disease, unspecified: Secondary | ICD-10-CM | POA: Diagnosis not present

## 2017-09-14 DIAGNOSIS — R159 Full incontinence of feces: Secondary | ICD-10-CM | POA: Diagnosis not present

## 2017-09-14 DIAGNOSIS — I10 Essential (primary) hypertension: Secondary | ICD-10-CM | POA: Diagnosis not present

## 2017-09-14 DIAGNOSIS — F419 Anxiety disorder, unspecified: Secondary | ICD-10-CM | POA: Diagnosis not present

## 2017-09-14 DIAGNOSIS — R32 Unspecified urinary incontinence: Secondary | ICD-10-CM | POA: Diagnosis not present

## 2017-09-15 DIAGNOSIS — F419 Anxiety disorder, unspecified: Secondary | ICD-10-CM | POA: Diagnosis not present

## 2017-09-15 DIAGNOSIS — G309 Alzheimer's disease, unspecified: Secondary | ICD-10-CM | POA: Diagnosis not present

## 2017-09-15 DIAGNOSIS — R159 Full incontinence of feces: Secondary | ICD-10-CM | POA: Diagnosis not present

## 2017-09-15 DIAGNOSIS — E039 Hypothyroidism, unspecified: Secondary | ICD-10-CM | POA: Diagnosis not present

## 2017-09-15 DIAGNOSIS — R32 Unspecified urinary incontinence: Secondary | ICD-10-CM | POA: Diagnosis not present

## 2017-09-15 DIAGNOSIS — I10 Essential (primary) hypertension: Secondary | ICD-10-CM | POA: Diagnosis not present

## 2017-09-17 DIAGNOSIS — I10 Essential (primary) hypertension: Secondary | ICD-10-CM | POA: Diagnosis not present

## 2017-09-17 DIAGNOSIS — R159 Full incontinence of feces: Secondary | ICD-10-CM | POA: Diagnosis not present

## 2017-09-17 DIAGNOSIS — E039 Hypothyroidism, unspecified: Secondary | ICD-10-CM | POA: Diagnosis not present

## 2017-09-17 DIAGNOSIS — G309 Alzheimer's disease, unspecified: Secondary | ICD-10-CM | POA: Diagnosis not present

## 2017-09-17 DIAGNOSIS — F419 Anxiety disorder, unspecified: Secondary | ICD-10-CM | POA: Diagnosis not present

## 2017-09-17 DIAGNOSIS — R32 Unspecified urinary incontinence: Secondary | ICD-10-CM | POA: Diagnosis not present

## 2017-09-18 DIAGNOSIS — R32 Unspecified urinary incontinence: Secondary | ICD-10-CM | POA: Diagnosis not present

## 2017-09-18 DIAGNOSIS — G309 Alzheimer's disease, unspecified: Secondary | ICD-10-CM | POA: Diagnosis not present

## 2017-09-18 DIAGNOSIS — I10 Essential (primary) hypertension: Secondary | ICD-10-CM | POA: Diagnosis not present

## 2017-09-18 DIAGNOSIS — F419 Anxiety disorder, unspecified: Secondary | ICD-10-CM | POA: Diagnosis not present

## 2017-09-18 DIAGNOSIS — E039 Hypothyroidism, unspecified: Secondary | ICD-10-CM | POA: Diagnosis not present

## 2017-09-18 DIAGNOSIS — R159 Full incontinence of feces: Secondary | ICD-10-CM | POA: Diagnosis not present

## 2017-09-23 DIAGNOSIS — E039 Hypothyroidism, unspecified: Secondary | ICD-10-CM | POA: Diagnosis not present

## 2017-09-23 DIAGNOSIS — I10 Essential (primary) hypertension: Secondary | ICD-10-CM | POA: Diagnosis not present

## 2017-09-23 DIAGNOSIS — R159 Full incontinence of feces: Secondary | ICD-10-CM | POA: Diagnosis not present

## 2017-09-23 DIAGNOSIS — F419 Anxiety disorder, unspecified: Secondary | ICD-10-CM | POA: Diagnosis not present

## 2017-09-23 DIAGNOSIS — R32 Unspecified urinary incontinence: Secondary | ICD-10-CM | POA: Diagnosis not present

## 2017-09-23 DIAGNOSIS — G309 Alzheimer's disease, unspecified: Secondary | ICD-10-CM | POA: Diagnosis not present

## 2017-09-25 DIAGNOSIS — F419 Anxiety disorder, unspecified: Secondary | ICD-10-CM | POA: Diagnosis not present

## 2017-09-25 DIAGNOSIS — I10 Essential (primary) hypertension: Secondary | ICD-10-CM | POA: Diagnosis not present

## 2017-09-25 DIAGNOSIS — R159 Full incontinence of feces: Secondary | ICD-10-CM | POA: Diagnosis not present

## 2017-09-25 DIAGNOSIS — G309 Alzheimer's disease, unspecified: Secondary | ICD-10-CM | POA: Diagnosis not present

## 2017-09-25 DIAGNOSIS — R32 Unspecified urinary incontinence: Secondary | ICD-10-CM | POA: Diagnosis not present

## 2017-09-25 DIAGNOSIS — E039 Hypothyroidism, unspecified: Secondary | ICD-10-CM | POA: Diagnosis not present

## 2017-09-28 DIAGNOSIS — R32 Unspecified urinary incontinence: Secondary | ICD-10-CM | POA: Diagnosis not present

## 2017-09-28 DIAGNOSIS — R159 Full incontinence of feces: Secondary | ICD-10-CM | POA: Diagnosis not present

## 2017-09-28 DIAGNOSIS — E039 Hypothyroidism, unspecified: Secondary | ICD-10-CM | POA: Diagnosis not present

## 2017-09-28 DIAGNOSIS — I10 Essential (primary) hypertension: Secondary | ICD-10-CM | POA: Diagnosis not present

## 2017-09-28 DIAGNOSIS — G309 Alzheimer's disease, unspecified: Secondary | ICD-10-CM | POA: Diagnosis not present

## 2017-09-28 DIAGNOSIS — F419 Anxiety disorder, unspecified: Secondary | ICD-10-CM | POA: Diagnosis not present

## 2017-09-30 DIAGNOSIS — R159 Full incontinence of feces: Secondary | ICD-10-CM | POA: Diagnosis not present

## 2017-09-30 DIAGNOSIS — R32 Unspecified urinary incontinence: Secondary | ICD-10-CM | POA: Diagnosis not present

## 2017-09-30 DIAGNOSIS — E039 Hypothyroidism, unspecified: Secondary | ICD-10-CM | POA: Diagnosis not present

## 2017-09-30 DIAGNOSIS — I10 Essential (primary) hypertension: Secondary | ICD-10-CM | POA: Diagnosis not present

## 2017-09-30 DIAGNOSIS — G309 Alzheimer's disease, unspecified: Secondary | ICD-10-CM | POA: Diagnosis not present

## 2017-09-30 DIAGNOSIS — F419 Anxiety disorder, unspecified: Secondary | ICD-10-CM | POA: Diagnosis not present

## 2017-10-02 DIAGNOSIS — I10 Essential (primary) hypertension: Secondary | ICD-10-CM | POA: Diagnosis not present

## 2017-10-02 DIAGNOSIS — F419 Anxiety disorder, unspecified: Secondary | ICD-10-CM | POA: Diagnosis not present

## 2017-10-02 DIAGNOSIS — R32 Unspecified urinary incontinence: Secondary | ICD-10-CM | POA: Diagnosis not present

## 2017-10-02 DIAGNOSIS — E039 Hypothyroidism, unspecified: Secondary | ICD-10-CM | POA: Diagnosis not present

## 2017-10-02 DIAGNOSIS — R159 Full incontinence of feces: Secondary | ICD-10-CM | POA: Diagnosis not present

## 2017-10-02 DIAGNOSIS — G309 Alzheimer's disease, unspecified: Secondary | ICD-10-CM | POA: Diagnosis not present

## 2017-10-05 DIAGNOSIS — G309 Alzheimer's disease, unspecified: Secondary | ICD-10-CM | POA: Diagnosis not present

## 2017-10-05 DIAGNOSIS — E039 Hypothyroidism, unspecified: Secondary | ICD-10-CM | POA: Diagnosis not present

## 2017-10-05 DIAGNOSIS — R32 Unspecified urinary incontinence: Secondary | ICD-10-CM | POA: Diagnosis not present

## 2017-10-05 DIAGNOSIS — F419 Anxiety disorder, unspecified: Secondary | ICD-10-CM | POA: Diagnosis not present

## 2017-10-05 DIAGNOSIS — I10 Essential (primary) hypertension: Secondary | ICD-10-CM | POA: Diagnosis not present

## 2017-10-05 DIAGNOSIS — R159 Full incontinence of feces: Secondary | ICD-10-CM | POA: Diagnosis not present

## 2017-10-07 DIAGNOSIS — I10 Essential (primary) hypertension: Secondary | ICD-10-CM | POA: Diagnosis not present

## 2017-10-07 DIAGNOSIS — G309 Alzheimer's disease, unspecified: Secondary | ICD-10-CM | POA: Diagnosis not present

## 2017-10-07 DIAGNOSIS — R159 Full incontinence of feces: Secondary | ICD-10-CM | POA: Diagnosis not present

## 2017-10-07 DIAGNOSIS — E039 Hypothyroidism, unspecified: Secondary | ICD-10-CM | POA: Diagnosis not present

## 2017-10-07 DIAGNOSIS — F419 Anxiety disorder, unspecified: Secondary | ICD-10-CM | POA: Diagnosis not present

## 2017-10-07 DIAGNOSIS — R32 Unspecified urinary incontinence: Secondary | ICD-10-CM | POA: Diagnosis not present

## 2017-10-08 DIAGNOSIS — E039 Hypothyroidism, unspecified: Secondary | ICD-10-CM | POA: Diagnosis not present

## 2017-10-08 DIAGNOSIS — F419 Anxiety disorder, unspecified: Secondary | ICD-10-CM | POA: Diagnosis not present

## 2017-10-08 DIAGNOSIS — R159 Full incontinence of feces: Secondary | ICD-10-CM | POA: Diagnosis not present

## 2017-10-08 DIAGNOSIS — I10 Essential (primary) hypertension: Secondary | ICD-10-CM | POA: Diagnosis not present

## 2017-10-08 DIAGNOSIS — G309 Alzheimer's disease, unspecified: Secondary | ICD-10-CM | POA: Diagnosis not present

## 2017-10-08 DIAGNOSIS — R32 Unspecified urinary incontinence: Secondary | ICD-10-CM | POA: Diagnosis not present

## 2017-10-09 DIAGNOSIS — E039 Hypothyroidism, unspecified: Secondary | ICD-10-CM | POA: Diagnosis not present

## 2017-10-09 DIAGNOSIS — G309 Alzheimer's disease, unspecified: Secondary | ICD-10-CM | POA: Diagnosis not present

## 2017-10-09 DIAGNOSIS — F419 Anxiety disorder, unspecified: Secondary | ICD-10-CM | POA: Diagnosis not present

## 2017-10-09 DIAGNOSIS — R159 Full incontinence of feces: Secondary | ICD-10-CM | POA: Diagnosis not present

## 2017-10-09 DIAGNOSIS — R32 Unspecified urinary incontinence: Secondary | ICD-10-CM | POA: Diagnosis not present

## 2017-10-09 DIAGNOSIS — I10 Essential (primary) hypertension: Secondary | ICD-10-CM | POA: Diagnosis not present

## 2017-10-12 DIAGNOSIS — E039 Hypothyroidism, unspecified: Secondary | ICD-10-CM | POA: Diagnosis not present

## 2017-10-12 DIAGNOSIS — I10 Essential (primary) hypertension: Secondary | ICD-10-CM | POA: Diagnosis not present

## 2017-10-12 DIAGNOSIS — G309 Alzheimer's disease, unspecified: Secondary | ICD-10-CM | POA: Diagnosis not present

## 2017-10-12 DIAGNOSIS — R32 Unspecified urinary incontinence: Secondary | ICD-10-CM | POA: Diagnosis not present

## 2017-10-12 DIAGNOSIS — R159 Full incontinence of feces: Secondary | ICD-10-CM | POA: Diagnosis not present

## 2017-10-12 DIAGNOSIS — F419 Anxiety disorder, unspecified: Secondary | ICD-10-CM | POA: Diagnosis not present

## 2017-10-13 DIAGNOSIS — G309 Alzheimer's disease, unspecified: Secondary | ICD-10-CM | POA: Diagnosis not present

## 2017-10-13 DIAGNOSIS — F419 Anxiety disorder, unspecified: Secondary | ICD-10-CM | POA: Diagnosis not present

## 2017-10-13 DIAGNOSIS — E039 Hypothyroidism, unspecified: Secondary | ICD-10-CM | POA: Diagnosis not present

## 2017-10-13 DIAGNOSIS — Z515 Encounter for palliative care: Secondary | ICD-10-CM | POA: Diagnosis not present

## 2017-10-13 DIAGNOSIS — R32 Unspecified urinary incontinence: Secondary | ICD-10-CM | POA: Diagnosis not present

## 2017-10-13 DIAGNOSIS — F0280 Dementia in other diseases classified elsewhere without behavioral disturbance: Secondary | ICD-10-CM | POA: Diagnosis not present

## 2017-10-13 DIAGNOSIS — R159 Full incontinence of feces: Secondary | ICD-10-CM | POA: Diagnosis not present

## 2017-10-13 DIAGNOSIS — I10 Essential (primary) hypertension: Secondary | ICD-10-CM | POA: Diagnosis not present

## 2017-10-14 DIAGNOSIS — E039 Hypothyroidism, unspecified: Secondary | ICD-10-CM | POA: Diagnosis not present

## 2017-10-14 DIAGNOSIS — G309 Alzheimer's disease, unspecified: Secondary | ICD-10-CM | POA: Diagnosis not present

## 2017-10-14 DIAGNOSIS — R159 Full incontinence of feces: Secondary | ICD-10-CM | POA: Diagnosis not present

## 2017-10-14 DIAGNOSIS — I10 Essential (primary) hypertension: Secondary | ICD-10-CM | POA: Diagnosis not present

## 2017-10-14 DIAGNOSIS — F0280 Dementia in other diseases classified elsewhere without behavioral disturbance: Secondary | ICD-10-CM | POA: Diagnosis not present

## 2017-10-14 DIAGNOSIS — R32 Unspecified urinary incontinence: Secondary | ICD-10-CM | POA: Diagnosis not present

## 2017-10-15 DIAGNOSIS — F0280 Dementia in other diseases classified elsewhere without behavioral disturbance: Secondary | ICD-10-CM | POA: Diagnosis not present

## 2017-10-15 DIAGNOSIS — R159 Full incontinence of feces: Secondary | ICD-10-CM | POA: Diagnosis not present

## 2017-10-15 DIAGNOSIS — R32 Unspecified urinary incontinence: Secondary | ICD-10-CM | POA: Diagnosis not present

## 2017-10-15 DIAGNOSIS — G309 Alzheimer's disease, unspecified: Secondary | ICD-10-CM | POA: Diagnosis not present

## 2017-10-15 DIAGNOSIS — E039 Hypothyroidism, unspecified: Secondary | ICD-10-CM | POA: Diagnosis not present

## 2017-10-15 DIAGNOSIS — I10 Essential (primary) hypertension: Secondary | ICD-10-CM | POA: Diagnosis not present

## 2017-10-16 DIAGNOSIS — F0280 Dementia in other diseases classified elsewhere without behavioral disturbance: Secondary | ICD-10-CM | POA: Diagnosis not present

## 2017-10-16 DIAGNOSIS — R32 Unspecified urinary incontinence: Secondary | ICD-10-CM | POA: Diagnosis not present

## 2017-10-16 DIAGNOSIS — R159 Full incontinence of feces: Secondary | ICD-10-CM | POA: Diagnosis not present

## 2017-10-16 DIAGNOSIS — I10 Essential (primary) hypertension: Secondary | ICD-10-CM | POA: Diagnosis not present

## 2017-10-16 DIAGNOSIS — G309 Alzheimer's disease, unspecified: Secondary | ICD-10-CM | POA: Diagnosis not present

## 2017-10-16 DIAGNOSIS — E039 Hypothyroidism, unspecified: Secondary | ICD-10-CM | POA: Diagnosis not present

## 2017-10-18 DIAGNOSIS — E039 Hypothyroidism, unspecified: Secondary | ICD-10-CM | POA: Diagnosis not present

## 2017-10-18 DIAGNOSIS — I10 Essential (primary) hypertension: Secondary | ICD-10-CM | POA: Diagnosis not present

## 2017-10-18 DIAGNOSIS — G309 Alzheimer's disease, unspecified: Secondary | ICD-10-CM | POA: Diagnosis not present

## 2017-10-18 DIAGNOSIS — F0280 Dementia in other diseases classified elsewhere without behavioral disturbance: Secondary | ICD-10-CM | POA: Diagnosis not present

## 2017-10-18 DIAGNOSIS — R32 Unspecified urinary incontinence: Secondary | ICD-10-CM | POA: Diagnosis not present

## 2017-10-18 DIAGNOSIS — R159 Full incontinence of feces: Secondary | ICD-10-CM | POA: Diagnosis not present

## 2017-10-19 DIAGNOSIS — F0280 Dementia in other diseases classified elsewhere without behavioral disturbance: Secondary | ICD-10-CM | POA: Diagnosis not present

## 2017-10-19 DIAGNOSIS — G309 Alzheimer's disease, unspecified: Secondary | ICD-10-CM | POA: Diagnosis not present

## 2017-10-19 DIAGNOSIS — I10 Essential (primary) hypertension: Secondary | ICD-10-CM | POA: Diagnosis not present

## 2017-10-19 DIAGNOSIS — R159 Full incontinence of feces: Secondary | ICD-10-CM | POA: Diagnosis not present

## 2017-10-19 DIAGNOSIS — R32 Unspecified urinary incontinence: Secondary | ICD-10-CM | POA: Diagnosis not present

## 2017-10-19 DIAGNOSIS — E039 Hypothyroidism, unspecified: Secondary | ICD-10-CM | POA: Diagnosis not present

## 2017-10-21 DIAGNOSIS — F0280 Dementia in other diseases classified elsewhere without behavioral disturbance: Secondary | ICD-10-CM | POA: Diagnosis not present

## 2017-10-21 DIAGNOSIS — G309 Alzheimer's disease, unspecified: Secondary | ICD-10-CM | POA: Diagnosis not present

## 2017-10-21 DIAGNOSIS — R159 Full incontinence of feces: Secondary | ICD-10-CM | POA: Diagnosis not present

## 2017-10-21 DIAGNOSIS — R32 Unspecified urinary incontinence: Secondary | ICD-10-CM | POA: Diagnosis not present

## 2017-10-21 DIAGNOSIS — E039 Hypothyroidism, unspecified: Secondary | ICD-10-CM | POA: Diagnosis not present

## 2017-10-21 DIAGNOSIS — I10 Essential (primary) hypertension: Secondary | ICD-10-CM | POA: Diagnosis not present

## 2017-10-22 DIAGNOSIS — G309 Alzheimer's disease, unspecified: Secondary | ICD-10-CM | POA: Diagnosis not present

## 2017-10-22 DIAGNOSIS — F0280 Dementia in other diseases classified elsewhere without behavioral disturbance: Secondary | ICD-10-CM | POA: Diagnosis not present

## 2017-10-22 DIAGNOSIS — I10 Essential (primary) hypertension: Secondary | ICD-10-CM | POA: Diagnosis not present

## 2017-10-22 DIAGNOSIS — R32 Unspecified urinary incontinence: Secondary | ICD-10-CM | POA: Diagnosis not present

## 2017-10-22 DIAGNOSIS — R159 Full incontinence of feces: Secondary | ICD-10-CM | POA: Diagnosis not present

## 2017-10-22 DIAGNOSIS — E039 Hypothyroidism, unspecified: Secondary | ICD-10-CM | POA: Diagnosis not present

## 2017-10-23 DIAGNOSIS — F0280 Dementia in other diseases classified elsewhere without behavioral disturbance: Secondary | ICD-10-CM | POA: Diagnosis not present

## 2017-10-23 DIAGNOSIS — E039 Hypothyroidism, unspecified: Secondary | ICD-10-CM | POA: Diagnosis not present

## 2017-10-23 DIAGNOSIS — I10 Essential (primary) hypertension: Secondary | ICD-10-CM | POA: Diagnosis not present

## 2017-10-23 DIAGNOSIS — R32 Unspecified urinary incontinence: Secondary | ICD-10-CM | POA: Diagnosis not present

## 2017-10-23 DIAGNOSIS — G309 Alzheimer's disease, unspecified: Secondary | ICD-10-CM | POA: Diagnosis not present

## 2017-10-23 DIAGNOSIS — R159 Full incontinence of feces: Secondary | ICD-10-CM | POA: Diagnosis not present

## 2017-10-24 DIAGNOSIS — E039 Hypothyroidism, unspecified: Secondary | ICD-10-CM | POA: Diagnosis not present

## 2017-10-24 DIAGNOSIS — R32 Unspecified urinary incontinence: Secondary | ICD-10-CM | POA: Diagnosis not present

## 2017-10-24 DIAGNOSIS — I10 Essential (primary) hypertension: Secondary | ICD-10-CM | POA: Diagnosis not present

## 2017-10-24 DIAGNOSIS — R159 Full incontinence of feces: Secondary | ICD-10-CM | POA: Diagnosis not present

## 2017-10-24 DIAGNOSIS — F0280 Dementia in other diseases classified elsewhere without behavioral disturbance: Secondary | ICD-10-CM | POA: Diagnosis not present

## 2017-10-24 DIAGNOSIS — G309 Alzheimer's disease, unspecified: Secondary | ICD-10-CM | POA: Diagnosis not present

## 2017-10-25 DIAGNOSIS — F0280 Dementia in other diseases classified elsewhere without behavioral disturbance: Secondary | ICD-10-CM | POA: Diagnosis not present

## 2017-10-25 DIAGNOSIS — R159 Full incontinence of feces: Secondary | ICD-10-CM | POA: Diagnosis not present

## 2017-10-25 DIAGNOSIS — I10 Essential (primary) hypertension: Secondary | ICD-10-CM | POA: Diagnosis not present

## 2017-10-25 DIAGNOSIS — E039 Hypothyroidism, unspecified: Secondary | ICD-10-CM | POA: Diagnosis not present

## 2017-10-25 DIAGNOSIS — R32 Unspecified urinary incontinence: Secondary | ICD-10-CM | POA: Diagnosis not present

## 2017-10-25 DIAGNOSIS — G309 Alzheimer's disease, unspecified: Secondary | ICD-10-CM | POA: Diagnosis not present

## 2017-10-26 DIAGNOSIS — E039 Hypothyroidism, unspecified: Secondary | ICD-10-CM | POA: Diagnosis not present

## 2017-10-26 DIAGNOSIS — I10 Essential (primary) hypertension: Secondary | ICD-10-CM | POA: Diagnosis not present

## 2017-10-26 DIAGNOSIS — G309 Alzheimer's disease, unspecified: Secondary | ICD-10-CM | POA: Diagnosis not present

## 2017-10-26 DIAGNOSIS — F0280 Dementia in other diseases classified elsewhere without behavioral disturbance: Secondary | ICD-10-CM | POA: Diagnosis not present

## 2017-10-26 DIAGNOSIS — R32 Unspecified urinary incontinence: Secondary | ICD-10-CM | POA: Diagnosis not present

## 2017-10-26 DIAGNOSIS — R159 Full incontinence of feces: Secondary | ICD-10-CM | POA: Diagnosis not present

## 2017-10-28 DIAGNOSIS — R159 Full incontinence of feces: Secondary | ICD-10-CM | POA: Diagnosis not present

## 2017-10-28 DIAGNOSIS — I10 Essential (primary) hypertension: Secondary | ICD-10-CM | POA: Diagnosis not present

## 2017-10-28 DIAGNOSIS — E039 Hypothyroidism, unspecified: Secondary | ICD-10-CM | POA: Diagnosis not present

## 2017-10-28 DIAGNOSIS — G309 Alzheimer's disease, unspecified: Secondary | ICD-10-CM | POA: Diagnosis not present

## 2017-10-28 DIAGNOSIS — F0280 Dementia in other diseases classified elsewhere without behavioral disturbance: Secondary | ICD-10-CM | POA: Diagnosis not present

## 2017-10-28 DIAGNOSIS — R32 Unspecified urinary incontinence: Secondary | ICD-10-CM | POA: Diagnosis not present

## 2017-10-29 DIAGNOSIS — E039 Hypothyroidism, unspecified: Secondary | ICD-10-CM | POA: Diagnosis not present

## 2017-10-29 DIAGNOSIS — I10 Essential (primary) hypertension: Secondary | ICD-10-CM | POA: Diagnosis not present

## 2017-10-29 DIAGNOSIS — R159 Full incontinence of feces: Secondary | ICD-10-CM | POA: Diagnosis not present

## 2017-10-29 DIAGNOSIS — R32 Unspecified urinary incontinence: Secondary | ICD-10-CM | POA: Diagnosis not present

## 2017-10-29 DIAGNOSIS — F0280 Dementia in other diseases classified elsewhere without behavioral disturbance: Secondary | ICD-10-CM | POA: Diagnosis not present

## 2017-10-29 DIAGNOSIS — G309 Alzheimer's disease, unspecified: Secondary | ICD-10-CM | POA: Diagnosis not present

## 2017-10-30 DIAGNOSIS — R32 Unspecified urinary incontinence: Secondary | ICD-10-CM | POA: Diagnosis not present

## 2017-10-30 DIAGNOSIS — I10 Essential (primary) hypertension: Secondary | ICD-10-CM | POA: Diagnosis not present

## 2017-10-30 DIAGNOSIS — R159 Full incontinence of feces: Secondary | ICD-10-CM | POA: Diagnosis not present

## 2017-10-30 DIAGNOSIS — E039 Hypothyroidism, unspecified: Secondary | ICD-10-CM | POA: Diagnosis not present

## 2017-10-30 DIAGNOSIS — G309 Alzheimer's disease, unspecified: Secondary | ICD-10-CM | POA: Diagnosis not present

## 2017-10-30 DIAGNOSIS — F0280 Dementia in other diseases classified elsewhere without behavioral disturbance: Secondary | ICD-10-CM | POA: Diagnosis not present

## 2017-11-02 DIAGNOSIS — G309 Alzheimer's disease, unspecified: Secondary | ICD-10-CM | POA: Diagnosis not present

## 2017-11-02 DIAGNOSIS — R159 Full incontinence of feces: Secondary | ICD-10-CM | POA: Diagnosis not present

## 2017-11-02 DIAGNOSIS — F0280 Dementia in other diseases classified elsewhere without behavioral disturbance: Secondary | ICD-10-CM | POA: Diagnosis not present

## 2017-11-02 DIAGNOSIS — R32 Unspecified urinary incontinence: Secondary | ICD-10-CM | POA: Diagnosis not present

## 2017-11-02 DIAGNOSIS — E039 Hypothyroidism, unspecified: Secondary | ICD-10-CM | POA: Diagnosis not present

## 2017-11-02 DIAGNOSIS — I10 Essential (primary) hypertension: Secondary | ICD-10-CM | POA: Diagnosis not present

## 2017-11-04 DIAGNOSIS — R32 Unspecified urinary incontinence: Secondary | ICD-10-CM | POA: Diagnosis not present

## 2017-11-04 DIAGNOSIS — G309 Alzheimer's disease, unspecified: Secondary | ICD-10-CM | POA: Diagnosis not present

## 2017-11-04 DIAGNOSIS — R159 Full incontinence of feces: Secondary | ICD-10-CM | POA: Diagnosis not present

## 2017-11-04 DIAGNOSIS — I10 Essential (primary) hypertension: Secondary | ICD-10-CM | POA: Diagnosis not present

## 2017-11-04 DIAGNOSIS — F0280 Dementia in other diseases classified elsewhere without behavioral disturbance: Secondary | ICD-10-CM | POA: Diagnosis not present

## 2017-11-04 DIAGNOSIS — E039 Hypothyroidism, unspecified: Secondary | ICD-10-CM | POA: Diagnosis not present

## 2017-11-05 DIAGNOSIS — E039 Hypothyroidism, unspecified: Secondary | ICD-10-CM | POA: Diagnosis not present

## 2017-11-05 DIAGNOSIS — G309 Alzheimer's disease, unspecified: Secondary | ICD-10-CM | POA: Diagnosis not present

## 2017-11-05 DIAGNOSIS — I10 Essential (primary) hypertension: Secondary | ICD-10-CM | POA: Diagnosis not present

## 2017-11-05 DIAGNOSIS — F0280 Dementia in other diseases classified elsewhere without behavioral disturbance: Secondary | ICD-10-CM | POA: Diagnosis not present

## 2017-11-05 DIAGNOSIS — R32 Unspecified urinary incontinence: Secondary | ICD-10-CM | POA: Diagnosis not present

## 2017-11-05 DIAGNOSIS — R159 Full incontinence of feces: Secondary | ICD-10-CM | POA: Diagnosis not present

## 2017-11-06 DIAGNOSIS — R159 Full incontinence of feces: Secondary | ICD-10-CM | POA: Diagnosis not present

## 2017-11-06 DIAGNOSIS — E039 Hypothyroidism, unspecified: Secondary | ICD-10-CM | POA: Diagnosis not present

## 2017-11-06 DIAGNOSIS — G309 Alzheimer's disease, unspecified: Secondary | ICD-10-CM | POA: Diagnosis not present

## 2017-11-06 DIAGNOSIS — R32 Unspecified urinary incontinence: Secondary | ICD-10-CM | POA: Diagnosis not present

## 2017-11-06 DIAGNOSIS — I10 Essential (primary) hypertension: Secondary | ICD-10-CM | POA: Diagnosis not present

## 2017-11-06 DIAGNOSIS — F0280 Dementia in other diseases classified elsewhere without behavioral disturbance: Secondary | ICD-10-CM | POA: Diagnosis not present

## 2017-11-07 DIAGNOSIS — R159 Full incontinence of feces: Secondary | ICD-10-CM | POA: Diagnosis not present

## 2017-11-07 DIAGNOSIS — I10 Essential (primary) hypertension: Secondary | ICD-10-CM | POA: Diagnosis not present

## 2017-11-07 DIAGNOSIS — G309 Alzheimer's disease, unspecified: Secondary | ICD-10-CM | POA: Diagnosis not present

## 2017-11-07 DIAGNOSIS — R32 Unspecified urinary incontinence: Secondary | ICD-10-CM | POA: Diagnosis not present

## 2017-11-07 DIAGNOSIS — E039 Hypothyroidism, unspecified: Secondary | ICD-10-CM | POA: Diagnosis not present

## 2017-11-07 DIAGNOSIS — F0280 Dementia in other diseases classified elsewhere without behavioral disturbance: Secondary | ICD-10-CM | POA: Diagnosis not present

## 2017-11-08 DIAGNOSIS — R159 Full incontinence of feces: Secondary | ICD-10-CM | POA: Diagnosis not present

## 2017-11-08 DIAGNOSIS — I10 Essential (primary) hypertension: Secondary | ICD-10-CM | POA: Diagnosis not present

## 2017-11-08 DIAGNOSIS — G309 Alzheimer's disease, unspecified: Secondary | ICD-10-CM | POA: Diagnosis not present

## 2017-11-08 DIAGNOSIS — R32 Unspecified urinary incontinence: Secondary | ICD-10-CM | POA: Diagnosis not present

## 2017-11-08 DIAGNOSIS — E039 Hypothyroidism, unspecified: Secondary | ICD-10-CM | POA: Diagnosis not present

## 2017-11-08 DIAGNOSIS — F0280 Dementia in other diseases classified elsewhere without behavioral disturbance: Secondary | ICD-10-CM | POA: Diagnosis not present

## 2017-11-09 DIAGNOSIS — G309 Alzheimer's disease, unspecified: Secondary | ICD-10-CM | POA: Diagnosis not present

## 2017-11-09 DIAGNOSIS — R159 Full incontinence of feces: Secondary | ICD-10-CM | POA: Diagnosis not present

## 2017-11-09 DIAGNOSIS — F0280 Dementia in other diseases classified elsewhere without behavioral disturbance: Secondary | ICD-10-CM | POA: Diagnosis not present

## 2017-11-09 DIAGNOSIS — E039 Hypothyroidism, unspecified: Secondary | ICD-10-CM | POA: Diagnosis not present

## 2017-11-09 DIAGNOSIS — I10 Essential (primary) hypertension: Secondary | ICD-10-CM | POA: Diagnosis not present

## 2017-11-09 DIAGNOSIS — R32 Unspecified urinary incontinence: Secondary | ICD-10-CM | POA: Diagnosis not present

## 2017-11-10 DIAGNOSIS — F0280 Dementia in other diseases classified elsewhere without behavioral disturbance: Secondary | ICD-10-CM | POA: Diagnosis not present

## 2017-11-10 DIAGNOSIS — R159 Full incontinence of feces: Secondary | ICD-10-CM | POA: Diagnosis not present

## 2017-11-10 DIAGNOSIS — G309 Alzheimer's disease, unspecified: Secondary | ICD-10-CM | POA: Diagnosis not present

## 2017-11-10 DIAGNOSIS — I10 Essential (primary) hypertension: Secondary | ICD-10-CM | POA: Diagnosis not present

## 2017-11-10 DIAGNOSIS — E039 Hypothyroidism, unspecified: Secondary | ICD-10-CM | POA: Diagnosis not present

## 2017-11-10 DIAGNOSIS — R32 Unspecified urinary incontinence: Secondary | ICD-10-CM | POA: Diagnosis not present

## 2017-11-13 DEATH — deceased
